# Patient Record
Sex: Male | Born: 1945
Health system: Southern US, Community
[De-identification: ages and names within clinical notes are randomized; demographics above are authoritative.]

## PROBLEM LIST (undated history)

## (undated) DIAGNOSIS — R269 Unspecified abnormalities of gait and mobility: Secondary | ICD-10-CM

## (undated) DIAGNOSIS — G248 Other dystonia: Secondary | ICD-10-CM

## (undated) DIAGNOSIS — G20 Parkinson's disease: Secondary | ICD-10-CM

## (undated) DIAGNOSIS — R413 Other amnesia: Secondary | ICD-10-CM

## (undated) DIAGNOSIS — G20A1 Parkinson's disease without dyskinesia, without mention of fluctuations: Secondary | ICD-10-CM

## (undated) DIAGNOSIS — F444 Conversion disorder with motor symptom or deficit: Secondary | ICD-10-CM

## (undated) HISTORY — PX: CATARACT EXTRACTION: SUR2

## (undated) HISTORY — DX: Unspecified abnormalities of gait and mobility: R26.9

## (undated) HISTORY — DX: Parkinson's disease without dyskinesia, without mention of fluctuations: G20.A1

## (undated) HISTORY — DX: Conversion disorder with motor symptom or deficit: F44.4

## (undated) HISTORY — DX: Parkinson's disease: G20

## (undated) HISTORY — DX: Other dystonia: G24.8

## (undated) HISTORY — PX: ANKLE SURGERY: SHX546

## (undated) HISTORY — DX: Other amnesia: R41.3

---

## 2014-08-09 DIAGNOSIS — R209 Unspecified disturbances of skin sensation: Secondary | ICD-10-CM | POA: Diagnosis not present

## 2014-08-09 DIAGNOSIS — L600 Ingrowing nail: Secondary | ICD-10-CM | POA: Diagnosis not present

## 2014-08-09 DIAGNOSIS — M2011 Hallux valgus (acquired), right foot: Secondary | ICD-10-CM | POA: Diagnosis not present

## 2014-08-09 DIAGNOSIS — B351 Tinea unguium: Secondary | ICD-10-CM | POA: Diagnosis not present

## 2014-08-09 DIAGNOSIS — M79661 Pain in right lower leg: Secondary | ICD-10-CM | POA: Diagnosis not present

## 2014-08-09 DIAGNOSIS — L853 Xerosis cutis: Secondary | ICD-10-CM | POA: Diagnosis not present

## 2014-08-09 DIAGNOSIS — B353 Tinea pedis: Secondary | ICD-10-CM | POA: Diagnosis not present

## 2014-08-20 DIAGNOSIS — G20 Parkinson's disease: Secondary | ICD-10-CM | POA: Diagnosis not present

## 2014-09-13 DIAGNOSIS — I491 Atrial premature depolarization: Secondary | ICD-10-CM | POA: Diagnosis not present

## 2014-09-13 DIAGNOSIS — I10 Essential (primary) hypertension: Secondary | ICD-10-CM | POA: Diagnosis not present

## 2014-09-13 DIAGNOSIS — F411 Generalized anxiety disorder: Secondary | ICD-10-CM | POA: Diagnosis not present

## 2014-09-13 DIAGNOSIS — Z01818 Encounter for other preprocedural examination: Secondary | ICD-10-CM | POA: Diagnosis not present

## 2014-09-13 DIAGNOSIS — M810 Age-related osteoporosis without current pathological fracture: Secondary | ICD-10-CM | POA: Diagnosis not present

## 2014-09-13 DIAGNOSIS — G20 Parkinson's disease: Secondary | ICD-10-CM | POA: Diagnosis not present

## 2014-09-14 DIAGNOSIS — M6281 Muscle weakness (generalized): Secondary | ICD-10-CM | POA: Diagnosis not present

## 2014-09-14 DIAGNOSIS — R293 Abnormal posture: Secondary | ICD-10-CM | POA: Diagnosis not present

## 2014-09-14 DIAGNOSIS — R262 Difficulty in walking, not elsewhere classified: Secondary | ICD-10-CM | POA: Diagnosis not present

## 2014-09-22 DIAGNOSIS — E559 Vitamin D deficiency, unspecified: Secondary | ICD-10-CM | POA: Diagnosis not present

## 2014-09-22 DIAGNOSIS — D649 Anemia, unspecified: Secondary | ICD-10-CM | POA: Diagnosis not present

## 2014-09-22 DIAGNOSIS — N4282 Prostatosis syndrome: Secondary | ICD-10-CM | POA: Diagnosis not present

## 2014-09-22 DIAGNOSIS — E1140 Type 2 diabetes mellitus with diabetic neuropathy, unspecified: Secondary | ICD-10-CM | POA: Diagnosis not present

## 2014-09-22 DIAGNOSIS — E069 Thyroiditis, unspecified: Secondary | ICD-10-CM | POA: Diagnosis not present

## 2014-09-22 DIAGNOSIS — E785 Hyperlipidemia, unspecified: Secondary | ICD-10-CM | POA: Diagnosis not present

## 2014-09-22 DIAGNOSIS — N390 Urinary tract infection, site not specified: Secondary | ICD-10-CM | POA: Diagnosis not present

## 2014-09-24 DIAGNOSIS — R293 Abnormal posture: Secondary | ICD-10-CM | POA: Diagnosis not present

## 2014-09-24 DIAGNOSIS — R262 Difficulty in walking, not elsewhere classified: Secondary | ICD-10-CM | POA: Diagnosis not present

## 2014-09-24 DIAGNOSIS — M6281 Muscle weakness (generalized): Secondary | ICD-10-CM | POA: Diagnosis not present

## 2014-09-27 DIAGNOSIS — R293 Abnormal posture: Secondary | ICD-10-CM | POA: Diagnosis not present

## 2014-09-27 DIAGNOSIS — R262 Difficulty in walking, not elsewhere classified: Secondary | ICD-10-CM | POA: Diagnosis not present

## 2014-09-27 DIAGNOSIS — M6281 Muscle weakness (generalized): Secondary | ICD-10-CM | POA: Diagnosis not present

## 2014-10-04 DIAGNOSIS — R262 Difficulty in walking, not elsewhere classified: Secondary | ICD-10-CM | POA: Diagnosis not present

## 2014-10-04 DIAGNOSIS — M6281 Muscle weakness (generalized): Secondary | ICD-10-CM | POA: Diagnosis not present

## 2014-10-04 DIAGNOSIS — R293 Abnormal posture: Secondary | ICD-10-CM | POA: Diagnosis not present

## 2014-10-14 DIAGNOSIS — J069 Acute upper respiratory infection, unspecified: Secondary | ICD-10-CM | POA: Diagnosis not present

## 2014-10-14 DIAGNOSIS — J449 Chronic obstructive pulmonary disease, unspecified: Secondary | ICD-10-CM | POA: Diagnosis not present

## 2014-10-14 DIAGNOSIS — R05 Cough: Secondary | ICD-10-CM | POA: Diagnosis not present

## 2014-10-14 DIAGNOSIS — G20 Parkinson's disease: Secondary | ICD-10-CM | POA: Diagnosis not present

## 2014-10-18 DIAGNOSIS — R262 Difficulty in walking, not elsewhere classified: Secondary | ICD-10-CM | POA: Diagnosis not present

## 2014-10-18 DIAGNOSIS — M6281 Muscle weakness (generalized): Secondary | ICD-10-CM | POA: Diagnosis not present

## 2014-10-18 DIAGNOSIS — R293 Abnormal posture: Secondary | ICD-10-CM | POA: Diagnosis not present

## 2014-10-25 DIAGNOSIS — M6281 Muscle weakness (generalized): Secondary | ICD-10-CM | POA: Diagnosis not present

## 2014-10-25 DIAGNOSIS — R293 Abnormal posture: Secondary | ICD-10-CM | POA: Diagnosis not present

## 2014-10-25 DIAGNOSIS — R262 Difficulty in walking, not elsewhere classified: Secondary | ICD-10-CM | POA: Diagnosis not present

## 2014-11-16 DIAGNOSIS — N39490 Overflow incontinence: Secondary | ICD-10-CM | POA: Diagnosis not present

## 2014-11-16 DIAGNOSIS — G20 Parkinson's disease: Secondary | ICD-10-CM | POA: Diagnosis not present

## 2014-11-16 DIAGNOSIS — M150 Primary generalized (osteo)arthritis: Secondary | ICD-10-CM | POA: Diagnosis not present

## 2014-11-16 DIAGNOSIS — M810 Age-related osteoporosis without current pathological fracture: Secondary | ICD-10-CM | POA: Diagnosis not present

## 2014-11-16 DIAGNOSIS — F411 Generalized anxiety disorder: Secondary | ICD-10-CM | POA: Diagnosis not present

## 2014-11-16 DIAGNOSIS — I739 Peripheral vascular disease, unspecified: Secondary | ICD-10-CM | POA: Diagnosis not present

## 2014-12-03 DIAGNOSIS — G20 Parkinson's disease: Secondary | ICD-10-CM | POA: Diagnosis not present

## 2014-12-03 DIAGNOSIS — G248 Other dystonia: Secondary | ICD-10-CM | POA: Diagnosis not present

## 2014-12-03 DIAGNOSIS — R258 Other abnormal involuntary movements: Secondary | ICD-10-CM | POA: Diagnosis not present

## 2014-12-03 DIAGNOSIS — F449 Dissociative and conversion disorder, unspecified: Secondary | ICD-10-CM | POA: Diagnosis not present

## 2014-12-03 DIAGNOSIS — G901 Familial dysautonomia [Riley-Day]: Secondary | ICD-10-CM | POA: Diagnosis not present

## 2014-12-03 DIAGNOSIS — F444 Conversion disorder with motor symptom or deficit: Secondary | ICD-10-CM | POA: Diagnosis not present

## 2015-02-15 DIAGNOSIS — M810 Age-related osteoporosis without current pathological fracture: Secondary | ICD-10-CM | POA: Diagnosis not present

## 2015-02-15 DIAGNOSIS — I10 Essential (primary) hypertension: Secondary | ICD-10-CM | POA: Diagnosis not present

## 2015-02-15 DIAGNOSIS — I739 Peripheral vascular disease, unspecified: Secondary | ICD-10-CM | POA: Diagnosis not present

## 2015-02-15 DIAGNOSIS — G20 Parkinson's disease: Secondary | ICD-10-CM | POA: Diagnosis not present

## 2015-02-24 DIAGNOSIS — N39490 Overflow incontinence: Secondary | ICD-10-CM | POA: Diagnosis not present

## 2015-02-24 DIAGNOSIS — R531 Weakness: Secondary | ICD-10-CM | POA: Diagnosis not present

## 2015-02-24 DIAGNOSIS — Z111 Encounter for screening for respiratory tuberculosis: Secondary | ICD-10-CM | POA: Diagnosis not present

## 2015-02-24 DIAGNOSIS — R5383 Other fatigue: Secondary | ICD-10-CM | POA: Diagnosis not present

## 2015-02-25 DIAGNOSIS — R5383 Other fatigue: Secondary | ICD-10-CM | POA: Diagnosis not present

## 2015-02-25 DIAGNOSIS — F411 Generalized anxiety disorder: Secondary | ICD-10-CM | POA: Diagnosis not present

## 2015-02-25 DIAGNOSIS — M150 Primary generalized (osteo)arthritis: Secondary | ICD-10-CM | POA: Diagnosis not present

## 2015-02-25 DIAGNOSIS — M5410 Radiculopathy, site unspecified: Secondary | ICD-10-CM | POA: Diagnosis not present

## 2015-02-25 DIAGNOSIS — I739 Peripheral vascular disease, unspecified: Secondary | ICD-10-CM | POA: Diagnosis not present

## 2015-02-25 DIAGNOSIS — N399 Disorder of urinary system, unspecified: Secondary | ICD-10-CM | POA: Diagnosis not present

## 2015-02-25 DIAGNOSIS — Z01818 Encounter for other preprocedural examination: Secondary | ICD-10-CM | POA: Diagnosis not present

## 2015-02-25 DIAGNOSIS — I10 Essential (primary) hypertension: Secondary | ICD-10-CM | POA: Diagnosis not present

## 2015-02-25 DIAGNOSIS — G20 Parkinson's disease: Secondary | ICD-10-CM | POA: Diagnosis not present

## 2015-02-25 DIAGNOSIS — G214 Vascular parkinsonism: Secondary | ICD-10-CM | POA: Diagnosis not present

## 2015-02-25 DIAGNOSIS — M810 Age-related osteoporosis without current pathological fracture: Secondary | ICD-10-CM | POA: Diagnosis not present

## 2015-02-25 DIAGNOSIS — R531 Weakness: Secondary | ICD-10-CM | POA: Diagnosis not present

## 2015-03-04 DIAGNOSIS — G64 Other disorders of peripheral nervous system: Secondary | ICD-10-CM | POA: Diagnosis not present

## 2015-03-04 DIAGNOSIS — R270 Ataxia, unspecified: Secondary | ICD-10-CM | POA: Diagnosis not present

## 2015-03-04 DIAGNOSIS — R42 Dizziness and giddiness: Secondary | ICD-10-CM | POA: Diagnosis not present

## 2015-03-04 DIAGNOSIS — G20 Parkinson's disease: Secondary | ICD-10-CM | POA: Diagnosis not present

## 2015-03-25 DIAGNOSIS — G20 Parkinson's disease: Secondary | ICD-10-CM | POA: Diagnosis not present

## 2015-03-25 DIAGNOSIS — R42 Dizziness and giddiness: Secondary | ICD-10-CM | POA: Diagnosis not present

## 2015-03-30 DIAGNOSIS — G64 Other disorders of peripheral nervous system: Secondary | ICD-10-CM | POA: Diagnosis not present

## 2015-04-26 DIAGNOSIS — M5410 Radiculopathy, site unspecified: Secondary | ICD-10-CM | POA: Diagnosis not present

## 2015-04-26 DIAGNOSIS — Z23 Encounter for immunization: Secondary | ICD-10-CM | POA: Diagnosis not present

## 2015-04-26 DIAGNOSIS — I10 Essential (primary) hypertension: Secondary | ICD-10-CM | POA: Diagnosis not present

## 2015-04-26 DIAGNOSIS — I739 Peripheral vascular disease, unspecified: Secondary | ICD-10-CM | POA: Diagnosis not present

## 2015-04-26 DIAGNOSIS — M810 Age-related osteoporosis without current pathological fracture: Secondary | ICD-10-CM | POA: Diagnosis not present

## 2015-04-26 DIAGNOSIS — G20 Parkinson's disease: Secondary | ICD-10-CM | POA: Diagnosis not present

## 2015-04-27 DIAGNOSIS — G544 Lumbosacral root disorders, not elsewhere classified: Secondary | ICD-10-CM | POA: Diagnosis not present

## 2015-05-16 DIAGNOSIS — R209 Unspecified disturbances of skin sensation: Secondary | ICD-10-CM | POA: Diagnosis not present

## 2015-05-16 DIAGNOSIS — L853 Xerosis cutis: Secondary | ICD-10-CM | POA: Diagnosis not present

## 2015-05-16 DIAGNOSIS — M79661 Pain in right lower leg: Secondary | ICD-10-CM | POA: Diagnosis not present

## 2015-05-16 DIAGNOSIS — L600 Ingrowing nail: Secondary | ICD-10-CM | POA: Diagnosis not present

## 2015-05-16 DIAGNOSIS — B351 Tinea unguium: Secondary | ICD-10-CM | POA: Diagnosis not present

## 2015-05-16 DIAGNOSIS — M2011 Hallux valgus (acquired), right foot: Secondary | ICD-10-CM | POA: Diagnosis not present

## 2015-05-16 DIAGNOSIS — B353 Tinea pedis: Secondary | ICD-10-CM | POA: Diagnosis not present

## 2015-06-30 DIAGNOSIS — M810 Age-related osteoporosis without current pathological fracture: Secondary | ICD-10-CM | POA: Diagnosis not present

## 2015-06-30 DIAGNOSIS — G20 Parkinson's disease: Secondary | ICD-10-CM | POA: Diagnosis not present

## 2015-06-30 DIAGNOSIS — N39490 Overflow incontinence: Secondary | ICD-10-CM | POA: Diagnosis not present

## 2015-06-30 DIAGNOSIS — I10 Essential (primary) hypertension: Secondary | ICD-10-CM | POA: Diagnosis not present

## 2015-06-30 DIAGNOSIS — F330 Major depressive disorder, recurrent, mild: Secondary | ICD-10-CM | POA: Diagnosis not present

## 2015-06-30 DIAGNOSIS — F411 Generalized anxiety disorder: Secondary | ICD-10-CM | POA: Diagnosis not present

## 2015-07-11 ENCOUNTER — Ambulatory Visit (INDEPENDENT_AMBULATORY_CARE_PROVIDER_SITE_OTHER): Payer: Medicare Other | Admitting: Neurology

## 2015-07-11 ENCOUNTER — Encounter: Payer: Self-pay | Admitting: Neurology

## 2015-07-11 VITALS — BP 143/76 | HR 92 | Ht 67.0 in | Wt 129.0 lb

## 2015-07-11 DIAGNOSIS — G20 Parkinson's disease: Secondary | ICD-10-CM | POA: Diagnosis not present

## 2015-07-11 DIAGNOSIS — R269 Unspecified abnormalities of gait and mobility: Secondary | ICD-10-CM

## 2015-07-11 HISTORY — DX: Unspecified abnormalities of gait and mobility: R26.9

## 2015-07-11 MED ORDER — CARBIDOPA-LEVODOPA 25-100 MG PO TABS: 2.0000 | ORAL_TABLET | Freq: Three times a day (TID) | ORAL | Status: DC

## 2015-07-11 NOTE — Progress Notes (Signed)
Reason for visit: Parkinson's disease  Referring physician: Dr. Elspeth Cho  Bradley Fleming is a 69 y.o. male  History of present illness:  Bradley Fleming is a 69 year old right-handed black male with a three-year history of a gradually progressive gait disorder. The patient began noting very early in the process that he was having difficulty straightening up his back. When he would stand up, he would develop a stooped posture that was quite prominent, associated with some back pain. When he sits down, the back pain goes away. The patient does have some gait instability, he will fall on occasion. He has fallen on 2 occasions within the last 3 or 4 months. The patient is using a walker for ambulation, he generally does not fall when using a walker. He does have some mild tremors affecting the jaw, occasionally with the left arm. The patient denies any weakness per se with exception that he has difficulty with flexion at the hip on the left. He denies issues controlling the bowels or the bladder. He has some difficulty with swallowing, particularly with liquids. He denies any visual complaints, or memory problems. The patient has been noncompliant with his medications, he just started taking his medications regularly 2 months ago. He is moving from the [place] area to this area, he has been here 5 days. He has not been exercising until just recently. With the medications and exercise, he is already made some improvement with his walking, but he continues to have significant flexion with standing. 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  Past Medical History  Diagnosis Date  . Parkinson disease (HCC)   . Focal dystonia   . Camptocormia   . Abnormality of gait 07/11/2015    Past Surgical History  Procedure Laterality Date  . Ankle surgery Right   . Cataract extraction      Family History  Problem Relation Age of Onset    . Heart failure Mother   . Cirrhosis Father   . Alcohol abuse Father   . Tremor Sister   . COPD Brother   . Diabetes Brother   . Heart failure Brother     Social history:  reports that he has never smoked. He has never used smokeless tobacco. He reports that he does not drink alcohol or use illicit drugs.  Medications:  Prior to Admission medications   Medication Sig Start Date End Date Taking? Authorizing Provider  carbidopa-levodopa (SINEMET IR) 25-100 MG tablet Take 2 tablets by mouth 3 (three) times daily.   Yes Historical Provider, MD  PARoxetine (PAXIL) 10 MG tablet Take 10 mg by mouth daily.   Yes Historical Provider, MD      Allergies  Allergen Reactions  . Banana     ROS:  Out of a complete 14 system review of symptoms, the patient complains only of the following symptoms, and all other reviewed systems are negative.  Constipation Memory loss, slurred speech, dizziness Depression  Blood pressure 143/76, pulse 92, height  (1.702 m), weight 129 lb (58.514 kg).  Physical Exam  General: The patient is alert and cooperative at the time of the examination.  Eyes: Pupils are equal, round, and reactive to light. Discs are flat bilaterally.  Neck: The neck is supple, no carotid bruits are noted.  Respiratory: The respiratory examination is clear.  Cardiovascular: The cardiovascular examination reveals a regular rate and rhythm, no obvious murmurs or rubs are noted.  Skin: Extremities are without significant edema.  Neurologic Exam  Mental status: The patient is alert and oriented x 3 at the time of the examination. The patient has apparent normal recent and remote memory, with an apparently normal attention span and concentration ability.  Cranial nerves: Facial symmetry is not present, there is a depression of the right nasolabial fold. There is good sensation of the face to pinprick and soft touch bilaterally. The strength of the facial muscles and the  muscles to head turning and shoulder shrug are normal bilaterally. Speech is well enunciated, no aphasia or dysarthria is noted. Extraocular movements are full. Visual fields are full. The tongue is midline, and the patient has symmetric elevation of the soft palate. No obvious hearing deficits are noted. Masking of the face is seen.  Motor: The motor testing reveals 5 over 5 strength of all 4 extremities, with exception that there is 4/5 strength with hip flexion on the left. Good symmetric motor tone is noted throughout.  Sensory: Sensory testing is intact to pinprick, soft touch, vibration sensation, and position sense on all 4 extremities, with exception that there is a stocking pattern pinprick sensory deficit up to the knees bilaterally.. No evidence of extinction is noted.  Coordination: Cerebellar testing reveals good finger-nose-finger and heel-to-shin bilaterally.  Gait and station: The patient is unable to stand with the arms crossed from a seated position. He is able to stand up otherwise, but he has a flexed posture, he is able to ambulate independently, slowness with walking. Tandem gait is unsteady, Romberg is negative.  Reflexes: Deep tendon reflexes are symmetric and normal bilaterally. Toes are downgoing bilaterally.   Assessment/Plan:  1. Parkinson's disease  2. Gait disorder  3. Low back pain  The patient has significant flexion with standing, felt to have camptocormia. He did not respond Botox therapy. He has not been compliant with medical therapy or with exercise until just recently. I will leave him on his current dose of Sinemet, prescription was called in. He will follow-up in 4 months. We may increase the medication significantly over time. I have emphasized that exercise needs to be done on a regular basis. They will contact me if new issues arise.  Bradley Fleming. Keith Genny Caulder MD 07/11/2015 7:07 PM  [facility] [address] [place], [place]  [id number]  Phone [phone] Fax [phone]

## 2015-07-11 NOTE — Patient Instructions (Signed)
Fall Prevention in the Home  Falls can cause injuries and can affect people from all age groups. There are many simple things that you can do to make your home safe and to help prevent falls. WHAT CAN I DO ON THE OUTSIDE OF MY HOME?  Regularly repair the edges of walkways and driveways and fix any cracks.  Remove high doorway thresholds.  Trim any shrubbery on the main path into your home.  Use bright outdoor lighting.  Clear walkways of debris and clutter, including tools and rocks.  Regularly check that handrails are securely fastened and in good repair. Both sides of any steps should have handrails.  Install guardrails along the edges of any raised decks or porches.  Have leaves, snow, and ice cleared regularly.  Use sand or salt on walkways during winter months.  In the garage, clean up any spills right away, including grease or oil spills. WHAT CAN I DO IN THE BATHROOM?  Use night lights.  Install grab bars by the toilet and in the tub and shower. Do not use towel bars as grab bars.  Use non-skid mats or decals on the floor of the tub or shower.  If you need to sit down while you are in the shower, use a plastic, non-slip stool..  Keep the floor dry. Immediately clean up any water that spills on the floor.  Remove soap buildup in the tub or shower on a regular basis.  Attach bath mats securely with double-sided non-slip rug tape.  Remove throw rugs and other tripping hazards from the floor. WHAT CAN I DO IN THE BEDROOM?  Use night lights.  Make sure that a bedside light is easy to reach.  Do not use oversized bedding that drapes onto the floor.  Have a firm chair that has side arms to use for getting dressed.  Remove throw rugs and other tripping hazards from the floor. WHAT CAN I DO IN THE KITCHEN?   Clean up any spills right away.  Avoid walking on wet floors.  Place frequently used items in easy-to-reach places.  If you need to reach for something  above you, use a sturdy step stool that has a grab bar.  Keep electrical cables out of the way.  Do not use floor polish or wax that makes floors slippery. If you have to use wax, make sure that it is non-skid floor wax.  Remove throw rugs and other tripping hazards from the floor. WHAT CAN I DO IN THE STAIRWAYS?  Do not leave any items on the stairs.  Make sure that there are handrails on both sides of the stairs. Fix handrails that are broken or loose. Make sure that handrails are as long as the stairways.  Check any carpeting to make sure that it is firmly attached to the stairs. Fix any carpet that is loose or worn.  Avoid having throw rugs at the top or bottom of stairways, or secure the rugs with carpet tape to prevent them from moving.  Make sure that you have a light switch at the top of the stairs and the bottom of the stairs. If you do not have them, have them installed. WHAT ARE SOME OTHER FALL PREVENTION TIPS?  Wear closed-toe shoes that fit well and support your feet. Wear shoes that have rubber soles or low heels.  When you use a stepladder, make sure that it is completely opened and that the sides are firmly locked. Have someone hold the ladder while you   are using it. Do not climb a closed stepladder.  Add color or contrast paint or tape to grab bars and handrails in your home. Place contrasting color strips on the first and last steps.  Use mobility aids as needed, such as canes, walkers, scooters, and crutches.  Turn on lights if it is dark. Replace any light bulbs that burn out.  Set up furniture so that there are clear paths. Keep the furniture in the same spot.  Fix any uneven floor surfaces.  Choose a carpet design that does not hide the edge of steps of a stairway.  Be aware of any and all pets.  Review your medicines with your healthcare provider. Some medicines can cause dizziness or changes in blood pressure, which increase your risk of falling. Talk  with your health care provider about other ways that you can decrease your risk of falls. This may include working with a physical therapist or trainer to improve your strength, balance, and endurance.   This information is not intended to replace advice given to you by your health care provider. Make sure you discuss any questions you have with your health care provider.   Document Released: 07/06/2002 Document Revised: 11/30/2014 Document Reviewed: 08/20/2014 Elsevier Interactive Patient Education 2016 Elsevier Inc.  

## 2015-07-12 ENCOUNTER — Telehealth: Payer: Self-pay | Admitting: Neurology

## 2015-07-12 MED ORDER — PAROXETINE HCL 10 MG PO TABS: 10.0000 mg | ORAL_TABLET | Freq: Every day | ORAL | Status: DC

## 2015-07-12 NOTE — Telephone Encounter (Signed)
Patient's wife is calling and states that her husband needs his Paxil prescribed.  She wanted to know if Dr. Anne HahnWillis could treat him for his depression also as they just moved here and need a doctor for the depression as well as Parkinson.  Please call the patient's wife and let her know if that is possible or if her husband needs to change doctors.  Thanks!

## 2015-07-12 NOTE — Telephone Encounter (Signed)
I called and talked with the wife, I will call in a prescription for the Paxil.

## 2015-08-11 DIAGNOSIS — G20 Parkinson's disease: Secondary | ICD-10-CM | POA: Diagnosis not present

## 2015-08-11 DIAGNOSIS — F331 Major depressive disorder, recurrent, moderate: Secondary | ICD-10-CM | POA: Diagnosis not present

## 2015-08-18 DIAGNOSIS — H47329 Drusen of optic disc, unspecified eye: Secondary | ICD-10-CM | POA: Diagnosis not present

## 2015-08-18 DIAGNOSIS — H40023 Open angle with borderline findings, high risk, bilateral: Secondary | ICD-10-CM | POA: Diagnosis not present

## 2015-08-18 DIAGNOSIS — T8522XA Displacement of intraocular lens, initial encounter: Secondary | ICD-10-CM | POA: Diagnosis not present

## 2015-08-22 DIAGNOSIS — F411 Generalized anxiety disorder: Secondary | ICD-10-CM | POA: Diagnosis not present

## 2015-08-22 DIAGNOSIS — F331 Major depressive disorder, recurrent, moderate: Secondary | ICD-10-CM | POA: Diagnosis not present

## 2015-08-30 DIAGNOSIS — M216X1 Other acquired deformities of right foot: Secondary | ICD-10-CM | POA: Diagnosis not present

## 2015-08-30 DIAGNOSIS — G64 Other disorders of peripheral nervous system: Secondary | ICD-10-CM | POA: Diagnosis not present

## 2015-08-30 DIAGNOSIS — M216X2 Other acquired deformities of left foot: Secondary | ICD-10-CM | POA: Diagnosis not present

## 2015-08-30 DIAGNOSIS — L602 Onychogryphosis: Secondary | ICD-10-CM | POA: Diagnosis not present

## 2015-08-31 DIAGNOSIS — L602 Onychogryphosis: Secondary | ICD-10-CM | POA: Diagnosis not present

## 2015-08-31 DIAGNOSIS — B351 Tinea unguium: Secondary | ICD-10-CM | POA: Diagnosis not present

## 2015-09-06 DIAGNOSIS — Z131 Encounter for screening for diabetes mellitus: Secondary | ICD-10-CM | POA: Diagnosis not present

## 2015-09-06 DIAGNOSIS — Z13220 Encounter for screening for lipoid disorders: Secondary | ICD-10-CM | POA: Diagnosis not present

## 2015-09-06 DIAGNOSIS — G8929 Other chronic pain: Secondary | ICD-10-CM | POA: Diagnosis not present

## 2015-09-06 DIAGNOSIS — Z0000 Encounter for general adult medical examination without abnormal findings: Secondary | ICD-10-CM | POA: Diagnosis not present

## 2015-09-06 DIAGNOSIS — Z136 Encounter for screening for cardiovascular disorders: Secondary | ICD-10-CM | POA: Diagnosis not present

## 2015-09-06 DIAGNOSIS — M4000 Postural kyphosis, site unspecified: Secondary | ICD-10-CM | POA: Diagnosis not present

## 2015-09-06 DIAGNOSIS — M546 Pain in thoracic spine: Secondary | ICD-10-CM | POA: Diagnosis not present

## 2015-09-06 DIAGNOSIS — N529 Male erectile dysfunction, unspecified: Secondary | ICD-10-CM | POA: Diagnosis not present

## 2015-10-03 ENCOUNTER — Telehealth: Payer: Self-pay | Admitting: Neurology

## 2015-10-03 DIAGNOSIS — G20 Parkinson's disease: Secondary | ICD-10-CM

## 2015-10-03 NOTE — Telephone Encounter (Signed)
I called the patient. The patient has apparently said unusual things last night, the patient himself indicates that he was sleeping at the time. He does have vivid drains and he will act out dreams. This only happened on one occasion, has not recurred. He is not confused during the day. It is true that the Sinemet should be taken at a time not concurrent with a high-protein meal. The protein may reduce absorption of the medication.  The wife is requesting physical therapy for the patient, I'll try to get this set up.

## 2015-10-03 NOTE — Telephone Encounter (Signed)
Patient's wife Bradley Hashimotoatricia stated that her husband was acting strange yesterday and was saying things that didn't make sense but is better today. She was advised to take him to the ER if needed. She also stated she needs a form for a handicapped parking tag filled out and she will leave at front desk today.  Thanks!

## 2015-10-03 NOTE — Telephone Encounter (Signed)
Spoke to pt's wife. She reports that yesterday pt was complaining of having "hot sauce in his eyes" even though he did not eat anything with hot sauce. Pt was also talking about dying and coming back as a bird or dog. Pt's wife states that this is very unusual for the pt.  Pt's wife also reports that she went to a meeting about Parkinson's disease given by [facility], and was told in the meeting to not give the pt his sinemet if he had recently eaten a lot of protein. She is asking if Dr. Anne HahnWillis thinks this is correct. She reports that the pt sleeps a lot after taking the sinemet as well.  I advised pt's wife that I would forward this message to Dr. Anne HahnWillis.

## 2015-10-05 DIAGNOSIS — F411 Generalized anxiety disorder: Secondary | ICD-10-CM | POA: Diagnosis not present

## 2015-10-05 DIAGNOSIS — F331 Major depressive disorder, recurrent, moderate: Secondary | ICD-10-CM | POA: Diagnosis not present

## 2015-10-26 DIAGNOSIS — L602 Onychogryphosis: Secondary | ICD-10-CM | POA: Diagnosis not present

## 2015-11-07 ENCOUNTER — Ambulatory Visit: Payer: Medicare Other | Attending: Neurology | Admitting: Physical Therapy

## 2015-11-07 DIAGNOSIS — R2689 Other abnormalities of gait and mobility: Secondary | ICD-10-CM | POA: Insufficient documentation

## 2015-11-07 DIAGNOSIS — R293 Abnormal posture: Secondary | ICD-10-CM | POA: Diagnosis not present

## 2015-11-07 NOTE — Therapy (Signed)
[facility] [address] [place], [place], [address] Phone: [phone]   Fax:  [phone]  Physical Therapy Evaluation  Patient Details  Name: Bradley Fleming MRN: [id number] Date of Birth: 06-27-1946 Referring Provider: Dr. Anne Hahn  Encounter Date: 11/07/2015      PT End of Session - 11/07/15 2059    Visit Number 1   Number of Visits 17   Date for PT Re-Evaluation 01/06/16   Authorization Type Medicare/BCBS secondary   PT Start Time 1020   PT Stop Time 1100   PT Time Calculation (min) 40 min   Activity Tolerance Patient tolerated treatment well   Behavior During Therapy [facility] for tasks assessed/performed      Past Medical History  Diagnosis Date  . Parkinson disease (HCC)   . Focal dystonia   . Camptocormia   . Abnormality of gait 07/11/2015    Past Surgical History  Procedure Laterality Date  . Ankle surgery Right   . Cataract extraction      There were no vitals filed for this visit.       Subjective Assessment - 11/07/15 1025    Subjective Pt is a 70 year old male who presents to OP PT wtih c/o bending over more and more; pt reports decreased balance.  He has had 2 falls in the past 2 months.  Falls occurred while walking and when trying to sit at the table.  He uses cane at home and uses the 4-wheeled RW for longer distances.   Patient is accompained by: Family member  wife-Patricia   Limitations --  Wife reports difficulty getting in and out of car   Patient Stated Goals Wants to build up muscles to improve posture.   Currently in Pain? Yes   Pain Score 5    Pain Location Back   Pain Descriptors / Indicators Aching   Pain Type Chronic pain   Pain Onset More than a month ago   Pain Frequency Intermittent   Aggravating Factors  Bending over more aggravates pain   Pain Relieving Factors lying down alleviates pain   Effect of Pain on Daily Activities PT will monitor; will attempt to address  with stretching and exercises.            [facility] PT Assessment - 11/07/15 0001    Assessment   Medical Diagnosis Parkinson's disease  dx 3 years ago (in [place])   Referring Provider Dr. Anne Hahn   Precautions   Precautions Fall  No driving   Balance Screen   Has the patient fallen in the past 6 months Yes   How many times? 2   Has the patient had a decrease in activity level because of a fear of falling?  No   Is the patient reluctant to leave their home because of a fear of falling?  No   Home Nurse, mental health Private residence   Living Arrangements Spouse/significant other   Available Help at Discharge Family   Type of Home --  [facility]   Home Access Level entry   Home Layout Two level;Bed/bath upstairs   Alternate Level Stairs-Rails Right   Home Equipment Walker - 4 wheels;Cane - single point;Tub bench   Prior Function   Level of Independence Independent with basic ADLs;Independent with household mobility with device  Slowed with gait and ADLs   Leisure [employer] 2-3 times per week   Posture/Postural Control   Posture/Postural Control Postural limitations  Postural Limitations Rounded Shoulders;Forward head;Posterior pelvic tilt  Camptocormia with excessive trunk flexion   Posture Comments L shoulder lower than right   ROM / Strength   AROM / PROM / Strength Strength;PROM  Pt reports L side weaker and stiffer   Strength   Strength Assessment Site Hip;Knee;Ankle   Right/Left Hip Right;Left   Right Hip Flexion 3+/5   Left Hip Flexion 3+/5   Right/Left Knee Right;Left   Right Knee Flexion 4/5   Right Knee Extension 4/5   Left Knee Flexion 3+/5   Left Knee Extension 4/5   Right/Left Ankle Right;Left   Right Ankle Dorsiflexion 4/5   Left Ankle Dorsiflexion 4/5   Transfers   Transfers Sit to Stand;Stand to Sit   Sit to Stand 5: Supervision;Without upper extremity assist;From chair/3-in-1   Sit to Stand Details --  does not  fully stand erect   Five time sit to stand comments  32.72   Stand to Sit 5: Supervision;Without upper extremity assist;To chair/3-in-1   Stand to Sit Details With sit<>stand in preparation for gait, pt places hands on walker to push up to stand.   Ambulation/Gait   Ambulation/Gait Yes   Ambulation/Gait Assistance 5: Supervision   Ambulation Distance (Feet) 150 Feet   Assistive device Rollator   Gait Pattern Step-through pattern;Decreased step length - right;Decreased step length - left;Trunk flexed;Poor foot clearance - left;Poor foot clearance - right   Ambulation Surface Level;Indoor   Gait velocity 21.05= 1.56 ft/sec   Standardized Balance Assessment   Standardized Balance Assessment Timed Up and Go Test   Timed Up and Go Test   Normal TUG (seconds) 32.26   High Level Balance   High Level Balance Comments Pt stands 90 seconds with increase in low back pain to 6-7/10.                             PT Short Term Goals - 11/07/15 2105    PT SHORT TERM GOAL #1   Title Pt will perform HEP with family supervision for improved posture, transfers, functional mobility and gait.  TARGET 12/07/15   Time 4   Period Weeks   Status New   PT SHORT TERM GOAL #2   Title Pt will improve 5x sit<>stand transfers to less than or equal to 25 seconds for improved transfer efficiency and safety.   Time 4   Period Weeks   Status New   PT SHORT TERM GOAL #3   Title Pt will improve TUG score to less than or equal to 25 seconds for decreased fall risk.   Time 4   Period Weeks   Status New   PT SHORT TERM GOAL #4   Title Pt will report at least 25% improvement in car transfers, for decreased caregiver burden.   Time 4   Period Weeks   Status New   PT SHORT TERM GOAL #5   Title 3 minute walk test to be assessed, with pt ambulating at least 50 additional ft in 3 minute walk, to demonstrate improved efficiency and safety with gait.   Time 4   Period Weeks   Status New            PT Long Term Goals - 11/07/15 2109    PT LONG TERM GOAL #1   Title Pt/family will verbalize understanding of fall prevention within the home environment.  TARGET 01/06/16   Time 8   Period Weeks  Status New   PT LONG TERM GOAL #2   Title Pt will improve 5x sit<>stand to less than or equal to 20 seconds for improved transfer efficiency and safety.   Time 8   Period Weeks   Status New   PT LONG TERM GOAL #3   Title Pt will improve TUG score to less than or equal 20 seconds for decreased fall risk.   Time 8   Period Weeks   Status New   PT LONG TERM GOAL #4   Title Pt will improve gait velocity to at least 1.8 ft/sec for decreased fall risk and improved gait efficiency and safety.   Time 8   Period Weeks   Status New   PT LONG TERM GOAL #5   Title Pt will report at least 25% improvement in bed mobility for improved functional mobility.   Time 8   Period Weeks   Status New   Additional Long Term Goals   Additional Long Term Goals Yes   PT LONG TERM GOAL #6   Title Pt will verbalize plans for continued community fitness upon D/C from PT.   Time 8   Period Weeks   Status New               Plan - 12-06-15 12-27-2057    Clinical Impression Statement Pt is a 70 year old male who presents to OP PT with history of Parkinson's disease with dystonia and camptocormia, history of ankle surgery and cataract removal.  Pt has recently moved to [facility] from [place] and is seeking therapy to assist with posture and strengthening.  Pt presents with forward flexed posture, back pain with increasing forward posture in unsupported standing, decreased strength, bradykinesia, slowed gait velocity, decreased timing and coordination of gait.  Pt presents as stable.  Pt does present as high fall risk in Berg and gait velocity scores.  Attempted Berg, but unable due to pain increasing in back with unsupported standing.  Pt would benefit from skilled PT to address the above stated deficits to improve  functional mobility and decrease fall risk.   Rehab Potential Good   PT Frequency 2x / week   PT Duration 8 weeks  plus eval   PT Treatment/Interventions ADLs/Self Care Home Management;Therapeutic exercise;Therapeutic activities;Functional mobility training;Gait training;DME Instruction;Balance training;Neuromuscular re-education;Patient/family education   PT Next Visit Plan Sit<>stand transfer technique, practice car transfers as able; initiate PWR! Moves in sitting and standing; Assess 3 minute walk test   Recommended Other Services Pt would benefit from OT services due to c/o increased time and effort with ADLs   Consulted and Agree with Plan of Care Patient;Family member/caregiver   Family Member Consulted wife      Patient will benefit from skilled therapeutic intervention in order to improve the following deficits and impairments:  Abnormal gait, Decreased activity tolerance, Decreased balance, Decreased mobility, Decreased endurance, Decreased strength, Difficulty walking, Postural dysfunction, Pain  Visit Diagnosis: Other abnormalities of gait and mobility  Abnormal posture      G-Codes - 2015/12/06 Dec 27, 2116    Functional Assessment Tool Used 5x sit<>stand 32.72 sec, TUG 32.26 sec, gait velocity 1.56 ft/sec; stands 1:30 unsupported prior to needing to sit due to back pain   Functional Limitation Mobility: Walking and moving around   Mobility: Walking and Moving Around Current Status [phone]) At least 40 percent but less than 60 percent impaired, limited or restricted   Mobility: Walking and Moving Around Goal Status (U0454) At least 20 percent but less  than 40 percent impaired, limited or restricted       Problem List Patient Active Problem List   Diagnosis Date Noted  . Parkinson disease (HCC) 07/11/2015  . Abnormality of gait 07/11/2015    MARRIOTT,AMY W. 11/07/2015, 9:20 PM  Gean MaidensMARRIOTT,AMY W., PT  [facility] [address] [place], [place], [address] Phone: [phone]   Fax:  [phone]  Name: Bradley Fleming MRN: [id number] Date of Birth: 08/25/1945

## 2015-11-08 ENCOUNTER — Other Ambulatory Visit: Payer: Self-pay | Admitting: Neurology

## 2015-11-08 DIAGNOSIS — G20 Parkinson's disease: Secondary | ICD-10-CM

## 2015-11-09 ENCOUNTER — Ambulatory Visit (INDEPENDENT_AMBULATORY_CARE_PROVIDER_SITE_OTHER): Payer: Medicare Other | Admitting: Neurology

## 2015-11-09 ENCOUNTER — Encounter: Payer: Self-pay | Admitting: Neurology

## 2015-11-09 VITALS — BP 123/83 | HR 82 | Ht 67.0 in | Wt 125.0 lb

## 2015-11-09 DIAGNOSIS — R269 Unspecified abnormalities of gait and mobility: Secondary | ICD-10-CM

## 2015-11-09 DIAGNOSIS — G20 Parkinson's disease: Secondary | ICD-10-CM | POA: Diagnosis not present

## 2015-11-09 DIAGNOSIS — R413 Other amnesia: Secondary | ICD-10-CM

## 2015-11-09 HISTORY — DX: Other amnesia: R41.3

## 2015-11-09 MED ORDER — CARBIDOPA-LEVODOPA 25-100 MG PO TABS: 2.0000 | ORAL_TABLET | Freq: Three times a day (TID) | ORAL | Status: DC

## 2015-11-09 NOTE — Progress Notes (Signed)
Reason for visit: Parkinson's disease  Bradley Fleming is an 71 y.o. male  History of present illness:  Bradley Fleming is a 70 year old right-handed black male with a history of Parkinson's disease associated with a gait disorder. The patient has significant flexion of the low back with standing consistent with camptocormia. He reports no significant falls since last seen. He remains on Sinemet, taking the medication daily, 2 of the 25/100 mg tablets 3 times daily. The patient has had some episodes of confusion that may occur when he is drowsy, or coming out of sleep. He is working out at [employer] on Mondays, Wednesdays, and Fridays. He appears to be easily distracted at times. His wife indicates that he does not keep up with his medications, appointments, and he has difficulty with remembering names. At other times, he appears to be completely lucid. He does report some occasional swallowing problems with solids and liquids. He comes to this office for an evaluation.  Past Medical History  Diagnosis Date  . Parkinson disease (HCC)   . Focal dystonia   . Camptocormia   . Abnormality of gait 07/11/2015    Past Surgical History  Procedure Laterality Date  . Ankle surgery Right   . Cataract extraction      Family History  Problem Relation Age of Onset  . Heart failure Mother   . Cirrhosis Father   . Alcohol abuse Father   . Tremor Sister   . COPD Brother   . Diabetes Brother   . Heart failure Brother     Social history:  reports that he has quit smoking. He has never used smokeless tobacco. He reports that he does not drink alcohol or use illicit drugs.    Allergies  Allergen Reactions  . Banana     Medications:  Prior to Admission medications   Medication Sig Start Date End Date Taking? Authorizing Provider  b complex vitamins tablet Take 1 tablet by mouth daily.   Yes Historical Provider, MD  carbidopa-levodopa (SINEMET IR) 25-100 MG tablet Take 2 tablets by mouth 3  (three) times daily. 07/11/15  Yes [name], MD  PARoxetine (PAXIL) 10 MG tablet Take 1 tablet (10 mg total) by mouth daily. 07/12/15  Yes [name], MD  sildenafil (REVATIO) 20 MG tablet  09/08/15  Yes Historical Provider, MD    ROS:  Out of a complete 14 system review of symptoms, the patient complains only of the following symptoms, and all other reviewed systems are negative.  Decreased activity Runny nose, difficulty swallowing, drooling Leg swelling Sleep talking Frequency of urination, urinary urgency Back pain, achy muscles, muscle cramps, walking difficulty, neck pain, neck stiffness Memory loss, dizziness, weakness, tremors Agitation, depression, anxiety  Blood pressure 123/83, pulse 82, height  (1.702 m), weight 125 lb (56.7 kg).  Physical Exam  General: The patient is alert and cooperative at the time of the examination.  Skin: No significant peripheral edema is noted.   Neurologic Exam  Mental status: The patient is alert and oriented x 2 at the time of the examination (Not oriented to date). The Mini-Mental Status Examination done today shows a total score 25/30. The patient is able to name 14 animals in 30 seconds..   Cranial nerves: Facial symmetry is present. Masking of the face is seen. Speech is normal, no aphasia or dysarthria is noted. Extraocular movements are full. Visual fields are full.  Motor: The patient has good strength in all 4 extremities.  Sensory examination: Soft touch sensation is symmetric on the face, arms, and legs.  Coordination: The patient has good finger-nose-finger and heel-to-shin bilaterally.  Gait and station: The patient is able to arise from a seated position with arms crossed with some difficulty. Once up, he has a flexed posture at the low back. He walks with a walker, has good strides and returns. Romberg is negative. Tandem gait is minimally unsteady.  Reflexes: Deep tendon reflexes are  symmetric.   Assessment/Plan:  1. Parkinson's disease  2. Gait disturbance  3. Mild memory disorder  The patient will be followed for memory issues over time. He will continue his current dose of Sinemet. He will follow-up in about 5 months. He is to remain active, continuing to exercise regularly.  Bradley Palau. Keith Willis MD 11/09/2015 7:47 PM  [facility] [address] [place], [place] [id number]  Phone [phone] Fax [phone]

## 2015-11-16 ENCOUNTER — Ambulatory Visit: Payer: Medicare Other | Admitting: Physical Therapy

## 2015-11-21 ENCOUNTER — Ambulatory Visit: Payer: Medicare Other | Admitting: Physical Therapy

## 2015-11-21 ENCOUNTER — Encounter: Payer: Self-pay | Admitting: Physical Therapy

## 2015-11-21 DIAGNOSIS — R293 Abnormal posture: Secondary | ICD-10-CM

## 2015-11-21 DIAGNOSIS — R2689 Other abnormalities of gait and mobility: Secondary | ICD-10-CM

## 2015-11-21 NOTE — Therapy (Signed)
[facility] [address] [place], [place], [address] Phone: [phone]   Fax:  [phone]  Physical Therapy Treatment  Patient Details  Name: Jamian Andujo MRN: [id number] Date of Birth: 01-27-1946 Referring Provider: Dr. Anne Hahn  Encounter Date: 11/21/2015        PT End of Session - 11/21/15 1448    Visit Number 2   Number of Visits 17   Date for PT Re-Evaluation 01/06/16   Authorization Type Medicare/BCBS secondary   PT Start Time 1405   PT Stop Time 1443   PT Time Calculation (min) 38 min   Activity Tolerance Patient tolerated treatment well   Behavior During Therapy [facility] for tasks assessed/performed      Past Medical History  Diagnosis Date  . Parkinson disease (HCC)   . Focal dystonia   . Camptocormia   . Abnormality of gait 07/11/2015  . Memory disorder 11/09/2015    Past Surgical History  Procedure Laterality Date  . Ankle surgery Right   . Cataract extraction      There were no vitals filed for this visit.      Subjective Assessment - 11/21/15 1409    Subjective Nothing new to report. Reports doing Silver Sneakers exercise group 3x/week (standing and seated)   Patient is accompained by: Family member  wife-Patricia   Limitations --  Wife reports difficulty getting in and out of car   Patient Stated Goals Wants to build up muscles to improve posture.   Currently in Pain? No/denies   Pain Onset More than a month ago                         [facility] Adult PT Treatment/Exercise - 11/21/15 0001    Ambulation/Gait   Ambulation/Gait Yes   Ambulation/Gait Assistance 5: Supervision   Ambulation/Gait Assistance Details walk test   Ambulation Distance (Feet) 330 Feet   Assistive device Rollator   Gait Pattern Step-to pattern;Decreased stride length  kyphotic posture   Ambulation Surface Level       Standing Posture strengthening: performed as shown in handout  below.         PT Education - 11/21/15 1425    Education provided Yes   Education Details Walking Program, posture strengthening   Person(s) Educated Patient   Methods Explanation;Handout   Comprehension Verbalized understanding;Need further instruction          PT Short Term Goals - 11/07/15 2105    PT SHORT TERM GOAL #1   Title Pt will perform HEP with family supervision for improved posture, transfers, functional mobility and gait.  TARGET 12/07/15   Time 4   Period Weeks   Status New   PT SHORT TERM GOAL #2   Title Pt will improve 5x sit<>stand transfers to less than or equal to 25 seconds for improved transfer efficiency and safety.   Time 4   Period Weeks   Status New   PT SHORT TERM GOAL #3   Title Pt will improve TUG score to less than or equal to 25 seconds for decreased fall risk.   Time 4   Period Weeks   Status New   PT SHORT TERM GOAL #4   Title Pt will report at least 25% improvement in car transfers, for decreased caregiver burden.   Time 4   Period Weeks   Status New   PT SHORT TERM GOAL #5   Title 3 minute walk  test to be assessed, with pt ambulating at least 50 additional ft in 3 minute walk, to demonstrate improved efficiency and safety with gait.   Time 4   Period Weeks   Status New           PT Long Term Goals - 11/07/15 2109    PT LONG TERM GOAL #1   Title Pt/family will verbalize understanding of fall prevention within the home environment.  TARGET 01/06/16   Time 8   Period Weeks   Status New   PT LONG TERM GOAL #2   Title Pt will improve 5x sit<>stand to less than or equal to 20 seconds for improved transfer efficiency and safety.   Time 8   Period Weeks   Status New   PT LONG TERM GOAL #3   Title Pt will improve TUG score to less than or equal 20 seconds for decreased fall risk.   Time 8   Period Weeks   Status New   PT LONG TERM GOAL #4   Title Pt will improve gait velocity to at least 1.8 ft/sec for decreased fall risk and  improved gait efficiency and safety.   Time 8   Period Weeks   Status New   PT LONG TERM GOAL #5   Title Pt will report at least 25% improvement in bed mobility for improved functional mobility.   Time 8   Period Weeks   Status New   Additional Long Term Goals   Additional Long Term Goals Yes   PT LONG TERM GOAL #6   Title Pt will verbalize plans for continued community fitness upon D/C from PT.   Time 8   Period Weeks   Status New               Plan - 11/21/15 1438    Clinical Impression Statement Initated HEP, pt reports being challenged by both walking and standing posture strengthening requiring seated rest.   PT Next Visit Plan Review HEP; Sit<>stand transfer technique, practice car transfers as able; initiate PWR! Moves in sitting and standing;       Patient will benefit from skilled therapeutic intervention in order to improve the following deficits and impairments:     Visit Diagnosis: Other abnormalities of gait and mobility  Abnormal posture     Problem List Patient Active Problem List   Diagnosis Date Noted  . Memory disorder 11/09/2015  . Parkinson disease (HCC) 07/11/2015  . Abnormality of gait 07/11/2015    Hortencia ConradiKarissa Dupree Givler, PTA  11/21/2015, 2:47 PM [place] [facility] [address] [place], [place], [address] Phone: [phone]   Fax:  [phone]  Name: Ferd GlassingGary David Dardis MRN: [id number] Date of Birth: 10/17/1945

## 2015-11-21 NOTE — Patient Instructions (Addendum)
Walking Program:  Begin walking for exercise for *3** minutes,rest then another 3 min/day, **3* days/week.   Progress your walking program by adding *1-2** minutes to your routine each week, as tolerated. Be sure to wear good walking shoes, walk in a safe environment and only progress to your tolerance.       Posture exercises Standing at the door frame: Stand with back against the door frame  1. Head retraction- keep chin level and pull head straight back to frame. 2. Shoulder blade squeezes 3. Alteranate Arm raises- Raise arm up towards door frame Hold each for 5 seconds, 5x each, perform 1-2x/day  * Remember to keep head level and shoulder back towards door frame

## 2015-11-23 DIAGNOSIS — D649 Anemia, unspecified: Secondary | ICD-10-CM | POA: Diagnosis not present

## 2015-11-23 DIAGNOSIS — R202 Paresthesia of skin: Secondary | ICD-10-CM | POA: Diagnosis not present

## 2015-11-23 DIAGNOSIS — R5383 Other fatigue: Secondary | ICD-10-CM | POA: Diagnosis not present

## 2015-11-23 DIAGNOSIS — G629 Polyneuropathy, unspecified: Secondary | ICD-10-CM | POA: Diagnosis not present

## 2015-11-23 DIAGNOSIS — G20 Parkinson's disease: Secondary | ICD-10-CM | POA: Diagnosis not present

## 2015-11-23 DIAGNOSIS — I499 Cardiac arrhythmia, unspecified: Secondary | ICD-10-CM | POA: Diagnosis not present

## 2015-11-28 ENCOUNTER — Ambulatory Visit: Payer: Medicare Other | Attending: Neurology | Admitting: Physical Therapy

## 2015-11-28 ENCOUNTER — Encounter: Payer: Self-pay | Admitting: Physical Therapy

## 2015-11-28 DIAGNOSIS — R29818 Other symptoms and signs involving the nervous system: Secondary | ICD-10-CM | POA: Insufficient documentation

## 2015-11-28 DIAGNOSIS — R41844 Frontal lobe and executive function deficit: Secondary | ICD-10-CM | POA: Diagnosis not present

## 2015-11-28 DIAGNOSIS — R29898 Other symptoms and signs involving the musculoskeletal system: Secondary | ICD-10-CM | POA: Insufficient documentation

## 2015-11-28 DIAGNOSIS — R293 Abnormal posture: Secondary | ICD-10-CM | POA: Diagnosis not present

## 2015-11-28 DIAGNOSIS — R2689 Other abnormalities of gait and mobility: Secondary | ICD-10-CM | POA: Insufficient documentation

## 2015-11-28 DIAGNOSIS — R278 Other lack of coordination: Secondary | ICD-10-CM | POA: Insufficient documentation

## 2015-11-28 NOTE — Patient Instructions (Addendum)
  Posture exercises Standing at the door frame: Stand with back against the door frame  1. Head retraction- keep chin level and pull head straight back to frame. 2. Shoulder blade squeezes 3. Alteranate Arm raises- Raise arm up towards door frame Hold each for 5 seconds, 5x each, perform 1-2x/day  * Remember to keep head level and shoulder back towards door frame

## 2015-11-28 NOTE — Therapy (Signed)
[facility] [address] [place], [place], [address] Phone: [phone]   Fax:  [phone]  Physical Therapy Treatment  Patient Details  Name: Bradley GlassingGary David Sivley MRN: [id number] Date of Birth: 11/17/1945 Referring Provider: Dr. Anne HahnWillis  Encounter Date: 11/28/2015      PT End of Session - 11/28/15 1405    Visit Number 3   Number of Visits 17   Date for PT Re-Evaluation 01/06/16   Authorization Type Medicare/BCBS secondary   PT Start Time 1323   PT Stop Time 1405   PT Time Calculation (min) 42 min   Activity Tolerance Patient tolerated treatment well   Behavior [facility] Therapy [facility] for tasks assessed/performed      Past Medical History  Diagnosis Date  . Parkinson disease (HCC)   . Focal dystonia   . Camptocormia   . Abnormality of gait 07/11/2015  . Memory disorder 11/09/2015    Past Surgical History  Procedure Laterality Date  . Ankle surgery Right   . Cataract extraction      There were no vitals filed for this visit.      Subjective Assessment - 11/28/15 1327    Subjective Did HEP every other day. Reports feeling exhausted after 3 min of walking for the walking program.   Patient is accompained by: Family member  wife-Patricia   Limitations --  Wife reports difficulty getting in and out of car   Patient Stated Goals Wants to build up muscles to improve posture.   Currently in Pain? Yes   Pain Score 5    Pain Location Back   Pain Descriptors / Indicators Aching   Pain Type Chronic pain   Pain Onset More than a month ago   Pain Frequency Intermittent                         [facility] Adult PT Treatment/Exercise - 11/28/15 0001    Transfers   Stand to Sit 4: Min guard  without UE support progressing to lower surface 2nd set of     Knee/Hip Exercises: Supine   Bridges 5 sets;Strengthening     Performed Postural strengthening as written in HEP (see handout below.) Supervision level  with intermittent UE Support.           PT Education - 11/28/15 1336    Education provided Yes   Education Details Reviewed HEP for standing postural strengthening   Person(s) Educated Patient   Methods Explanation;Demonstration;Handout   Comprehension Verbalized understanding;Returned demonstration;Verbal cues required          PT Short Term Goals - 11/07/15 2105    PT SHORT TERM GOAL #1   Title Pt will perform HEP with family supervision for improved posture, transfers, functional mobility and gait.  TARGET 12/07/15   Time 4   Period Weeks   Status New   PT SHORT TERM GOAL #2   Title Pt will improve 5x sit<>stand transfers to less than or equal to 25 seconds for improved transfer efficiency and safety.   Time 4   Period Weeks   Status New   PT SHORT TERM GOAL #3   Title Pt will improve TUG score to less than or equal to 25 seconds for decreased fall risk.   Time 4   Period Weeks   Status New   PT SHORT TERM GOAL #4   Title Pt will report at least 25% improvement in car transfers, for decreased caregiver burden.  Time 4   Period Weeks   Status New   PT SHORT TERM GOAL #5   Title 3 minute walk test to be assessed, with pt ambulating at least 50 additional ft in 3 minute walk, to demonstrate improved efficiency and safety with gait.   Time 4   Period Weeks   Status New           PT Long Term Goals - 11/07/15 2109    PT LONG TERM GOAL #1   Title Pt/family will verbalize understanding of fall prevention within the home environment.  TARGET 01/06/16   Time 8   Period Weeks   Status New   PT LONG TERM GOAL #2   Title Pt will improve 5x sit<>stand to less than or equal to 20 seconds for improved transfer efficiency and safety.   Time 8   Period Weeks   Status New   PT LONG TERM GOAL #3   Title Pt will improve TUG score to less than or equal 20 seconds for decreased fall risk.   Time 8   Period Weeks   Status New   PT LONG TERM GOAL #4   Title Pt will  improve gait velocity to at least 1.8 ft/sec for decreased fall risk and improved gait efficiency and safety.   Time 8   Period Weeks   Status New   PT LONG TERM GOAL #5   Title Pt will report at least 25% improvement in bed mobility for improved functional mobility.   Time 8   Period Weeks   Status New   Additional Long Term Goals   Additional Long Term Goals Yes   PT LONG TERM GOAL #6   Title Pt will verbalize plans for continued community fitness upon D/C from PT.   Time 8   Period Weeks   Status New               Plan - 11/28/15 1340    Clinical Impression Statement skilled session focused on  HEP review and Le strengthening.  Pt continues to be challenged with HEP and demonstrates significant hip weakness with bridging.   PT Next Visit Plan Hip strengthening, practice car transfers as able; initiate PWR! Moves in sitting and standing;       Patient will benefit from skilled therapeutic intervention in order to improve the following deficits and impairments:     Visit Diagnosis: Other abnormalities of gait and mobility  Abnormal posture     Problem List Patient Active Problem List   Diagnosis Date Noted  . Memory disorder 11/09/2015  . Parkinson disease (HCC) 07/11/2015  . Abnormality of gait 07/11/2015    .Hortencia Conradi, PTA  11/28/2015, 2:09 PM [place] [facility] [address] [place], [place], [address] Phone: [phone]   Fax:  [phone]  Name: Cliford Sequeira MRN: [id number] Date of Birth: Apr 28, 1946

## 2015-11-29 DIAGNOSIS — N529 Male erectile dysfunction, unspecified: Secondary | ICD-10-CM | POA: Diagnosis not present

## 2015-11-29 DIAGNOSIS — B351 Tinea unguium: Secondary | ICD-10-CM | POA: Diagnosis not present

## 2015-11-29 DIAGNOSIS — D649 Anemia, unspecified: Secondary | ICD-10-CM | POA: Diagnosis not present

## 2015-11-29 DIAGNOSIS — L853 Xerosis cutis: Secondary | ICD-10-CM | POA: Diagnosis not present

## 2015-11-29 DIAGNOSIS — G20 Parkinson's disease: Secondary | ICD-10-CM | POA: Diagnosis not present

## 2015-11-29 DIAGNOSIS — G609 Hereditary and idiopathic neuropathy, unspecified: Secondary | ICD-10-CM | POA: Diagnosis not present

## 2015-11-30 ENCOUNTER — Ambulatory Visit: Payer: Medicare Other | Admitting: Physical Therapy

## 2015-11-30 ENCOUNTER — Encounter: Payer: Self-pay | Admitting: Physical Therapy

## 2015-11-30 DIAGNOSIS — R278 Other lack of coordination: Secondary | ICD-10-CM | POA: Diagnosis not present

## 2015-11-30 DIAGNOSIS — R293 Abnormal posture: Secondary | ICD-10-CM | POA: Diagnosis not present

## 2015-11-30 DIAGNOSIS — R2689 Other abnormalities of gait and mobility: Secondary | ICD-10-CM | POA: Diagnosis not present

## 2015-11-30 DIAGNOSIS — R29818 Other symptoms and signs involving the nervous system: Secondary | ICD-10-CM | POA: Diagnosis not present

## 2015-11-30 DIAGNOSIS — R29898 Other symptoms and signs involving the musculoskeletal system: Secondary | ICD-10-CM | POA: Diagnosis not present

## 2015-11-30 DIAGNOSIS — R41844 Frontal lobe and executive function deficit: Secondary | ICD-10-CM | POA: Diagnosis not present

## 2015-11-30 NOTE — Therapy (Signed)
[facility] [address] [place], [place], [address] Phone: [phone]   Fax:  [phone]  Physical Therapy Treatment  Patient Details  Name: Bradley GlassingGary David Fleming MRN: [id number] Date of Birth: 01/25/1946 Referring Provider: Dr. Anne HahnWillis  Encounter Date: 11/30/2015      PT End of Session - 11/30/15 1554    Visit Number 4   Number of Visits 17   Date for PT Re-Evaluation 01/06/16   Authorization Type Medicare/BCBS secondary   PT Start Time 1405   PT Stop Time 1445   PT Time Calculation (min) 40 min   Activity Tolerance Patient tolerated treatment well   Behavior During Therapy [facility] for tasks assessed/performed      Past Medical History  Diagnosis Date  . Parkinson disease (HCC)   . Focal dystonia   . Camptocormia   . Abnormality of gait 07/11/2015  . Memory disorder 11/09/2015    Past Surgical History  Procedure Laterality Date  . Ankle surgery Right   . Cataract extraction      There were no vitals filed for this visit.      Subjective Assessment - 11/30/15 1414    Subjective [facility] of weeks due to wife having multiple appointments.  [facility]   Patient is accompained by: Family member  wife-Patricia   Limitations --  Wife reports difficulty getting in and out of car   Patient Stated Goals Wants to build up muscles to improve posture.   Currently in Pain? Yes   Pain Score 5    Pain Location Back   Pain Descriptors / Indicators Aching   Pain Type Chronic pain   Pain Onset More than a month ago   Pain Frequency Intermittent                         [facility] Adult PT Treatment/Exercise - 11/30/15 0001    Lumbar Exercises: Stretches   Single Knee to Chest Stretch 2 reps;20 seconds   Knee/Hip Exercises: Aerobic   Nustep L=4 10min            PWR [facility]) - 11/30/15 1424    PWR! Up 10x   PWR! Rock 10x   PWR! Twist 10x   PWR! Step 10x   Comments supine; knees bent             PT Education - 11/30/15 1553    Education provided Yes   Education Details Supine PWR! Moves to address deficits with transitional movements   Person(s) Educated Patient   Methods Explanation;Demonstration;Tactile cues;Verbal cues;Handout   Comprehension Verbalized understanding;Need further instruction;Returned demonstration;Verbal cues required;Tactile cues required          PT Short Term Goals - 11/07/15 2105    PT SHORT TERM GOAL #1   Title Pt will perform HEP with family supervision for improved posture, transfers, functional mobility and gait.  TARGET 12/07/15   Time 4   Period Weeks   Status New   PT SHORT TERM GOAL #2   Title Pt will improve 5x sit<>stand transfers to less than or equal to 25 seconds for improved transfer efficiency and safety.   Time 4   Period Weeks   Status New   PT SHORT TERM GOAL #3   Title Pt will improve TUG score to less than or equal to 25 seconds for decreased fall risk.   Time 4  Period Weeks   Status New   PT SHORT TERM GOAL #4   Title Pt will report at least 25% improvement in car transfers, for decreased caregiver burden.   Time 4   Period Weeks   Status New   PT SHORT TERM GOAL #5   Title 3 minute walk test to be assessed, with pt ambulating at least 50 additional ft in 3 minute walk, to demonstrate improved efficiency and safety with gait.   Time 4   Period Weeks   Status New           PT Long Term Goals - 11/07/15 2109    PT LONG TERM GOAL #1   Title Pt/family will verbalize understanding of fall prevention within the home environment.  TARGET 01/06/16   Time 8   Period Weeks   Status New   PT LONG TERM GOAL #2   Title Pt will improve 5x sit<>stand to less than or equal to 20 seconds for improved transfer efficiency and safety.   Time 8   Period Weeks   Status New   PT LONG TERM GOAL #3   Title Pt will improve TUG score to less than or equal 20 seconds for decreased fall  risk.   Time 8   Period Weeks   Status New   PT LONG TERM GOAL #4   Title Pt will improve gait velocity to at least 1.8 ft/sec for decreased fall risk and improved gait efficiency and safety.   Time 8   Period Weeks   Status New   PT LONG TERM GOAL #5   Title Pt will report at least 25% improvement in bed mobility for improved functional mobility.   Time 8   Period Weeks   Status New   Additional Long Term Goals   Additional Long Term Goals Yes   PT LONG TERM GOAL #6   Title Pt will verbalize plans for continued community fitness upon D/C from PT.   Time 8   Period Weeks   Status New               Plan - 11/30/15 1425    Clinical Impression Statement Skilled session focused on strengthening introducing PWR! Moves in supine for upper trunk/ posture strengthening and activity tolerance on nustep.   PT Next Visit Plan Hip strengthening, practice car transfers as able; initiate PWR! Moves in sitting and standing and supine.       Patient will benefit from skilled therapeutic intervention in order to improve the following deficits and impairments:     Visit Diagnosis: Other abnormalities of gait and mobility  Abnormal posture     Problem List Patient Active Problem List   Diagnosis Date Noted  . Memory disorder 11/09/2015  . Parkinson disease (HCC) 07/11/2015  . Abnormality of gait 07/11/2015   Hortencia Conradi, PTA  11/30/2015, 3:56 PM [place] [facility] [address] [place], [place], [address] Phone: [phone]   Fax:  [phone]  Name: Bradley Fleming MRN: [id number] Date of Birth: 02-15-1946

## 2015-12-01 ENCOUNTER — Ambulatory Visit: Payer: Medicare Other | Admitting: Occupational Therapy

## 2015-12-01 DIAGNOSIS — R41844 Frontal lobe and executive function deficit: Secondary | ICD-10-CM

## 2015-12-01 DIAGNOSIS — R29818 Other symptoms and signs involving the nervous system: Secondary | ICD-10-CM

## 2015-12-01 DIAGNOSIS — R29898 Other symptoms and signs involving the musculoskeletal system: Secondary | ICD-10-CM | POA: Diagnosis not present

## 2015-12-01 DIAGNOSIS — R278 Other lack of coordination: Secondary | ICD-10-CM

## 2015-12-01 DIAGNOSIS — F411 Generalized anxiety disorder: Secondary | ICD-10-CM | POA: Diagnosis not present

## 2015-12-01 DIAGNOSIS — R293 Abnormal posture: Secondary | ICD-10-CM | POA: Diagnosis not present

## 2015-12-01 DIAGNOSIS — R2689 Other abnormalities of gait and mobility: Secondary | ICD-10-CM

## 2015-12-01 DIAGNOSIS — F331 Major depressive disorder, recurrent, moderate: Secondary | ICD-10-CM | POA: Diagnosis not present

## 2015-12-01 NOTE — Therapy (Signed)
[facility] [address] [place], [place], [address] Phone: [phone]   Fax:  [phone]  Occupational Therapy Evaluation  Patient Details  Name: Bradley Fleming MRN: [id number] Date of Birth: [birth year] Referring Provider: Dr. Anne Hahn  Encounter Date: 12/01/2015      OT End of Session - 12/01/15 0943    Visit Number 1   Number of Visits 17   Date for OT Re-Evaluation 01/29/16   Authorization Type Medicare   Authorization - Visit Number 1   Authorization - Number of Visits 10   OT Start Time [phone]   OT Stop Time 1015   OT Time Calculation (min) 36 min   Activity Tolerance Patient tolerated treatment well   Behavior During Therapy [facility] for tasks assessed/performed;Flat affect      Past Medical History  Diagnosis Date  . Parkinson disease (HCC)   . Focal dystonia   . Camptocormia   . Abnormality of gait 07/11/2015  . Memory disorder 11/09/2015    Past Surgical History  Procedure Laterality Date  . Ankle surgery Right   . Cataract extraction      There were no vitals filed for this visit.      Subjective Assessment - 12/01/15 1034    Subjective  Pt with PD presents with bradykinesia, decreased coordination, rigidity, abnormal posture, cognitive deficits   Pertinent History see Epic   Patient Stated Goals to stand up straighter   Currently in Pain? Yes   Pain Score 4    Pain Location Back   Pain Descriptors / Indicators Aching   Pain Type Chronic pain   Pain Onset More than a month ago   Pain Frequency Intermittent   Aggravating Factors  malpositioning   Pain Relieving Factors repositioning   Multiple Pain Sites No,  OT will monitor pain yet will not address directly           [facility] OT Assessment - 12/01/15 0001    Assessment   Diagnosis Parkinson's disease   Referring Provider Dr. Anne Hahn   Onset Date --  approx 1 year ago diagnosed   Prior Therapy OT/PT   Precautions   Precautions Fall    Balance Screen   Has the patient fallen in the past 6 months No   Has the patient had a decrease in activity level because of a fear of falling?  No   Is the patient reluctant to leave their home because of a fear of falling?  No   Home  Environment   Family/patient expects to be discharged to: Private residence   Home Access Stairs   Home Layout Two level   Alternate Level Stairs - Number of Steps 12   Bathroom Shower/Tub Walk-in Shower  has shower seat   Lives With Spouse   Prior Function   Level of Independence Independent with basic ADLs;Independent with household mobility with device   Leisure [employer] at [employer] 2-3 times per week   ADL   Eating/Feeding Independent   Grooming Independent   Upper Body Bathing Modified independent  increased time   Lower Body Bathing Modified independent   Upper Body Dressing Increased time;Independent   Lower Body Dressing Increased time;Modified independent   Toilet Tranfer Modified independent  Pt reports difficulty getting up and down at times   Tub/Shower Transfer Modified independent   ADL comments Pt requires increased time. Pt demonstrates camptocormia   IADL   Light Housekeeping --  Washes dishes   Meal  Prep Able to complete simple warm meal prep  cooks breakfast at times   Mobility   Mobility Status --  modified indpendent with rollator   Written Expression   Dominant Hand Right   Handwriting 100% legible  no   Cognition   Overall Cognitive Status Impaired/Different from baseline   Memory Impaired   Memory Impairment Decreased short term memory   Observation/Other Assessments   Other Surveys  Select   Physical Performance Test   Yes   Simulated Eating Time (seconds) 18.68 secs    Simulated Eating Comments Pt eats with let hand   Donning Doffing Jacket Time (seconds) 93.75 secs   Coordination   Gross Motor Movements are Fluid and Coordinated No   Fine Motor Movements are Fluid and Coordinated No   Right 9  Hole Peg Test 39.12 secs   Left 9 Hole Peg Test 59.47 secs   Box and Blocks RUE 32, LUE 33   Coordination impaired left more than right   ROM / Strength   AROM / PROM / Strength AROM   AROM   Overall AROM  Deficits   AROM Assessment Site Shoulder;Elbow;Forearm;Wrist;Finger;Thumb   Right/Left Shoulder Right;Left   Right Shoulder Flexion 105 Degrees   Left Shoulder Flexion 100 Degrees   Right/Left Elbow Right;Left   Right Elbow Flexion --  WFLS   Right Elbow Extension -36   Left Elbow Flexion --  WFLs   Left Elbow Extension -40   Right/Left Forearm Right;Left   Right Forearm Pronation --  WFLs   Right Forearm Supination --  75%   Left Forearm Pronation --  WFLs   Left Forearm Supination --  75%   Right/Left Finger Right   Right Composite Finger Extension --  95%   Right Composite Finger Flexion --  95%   Left Composite Finger Extension --  80%, hyperext at PIP's for ring and middle   Left Composite Finger Flexion --  90%   Right/Left Thumb Left   Left Thumb Opposition --  flex 50-55,very limited movement at thumb unable to extend                                                       OT Short Term Goals - 12/01/15 1134    OT SHORT TERM GOAL #1   Title I with PD specific HEP- check 12/31/15   Time 4   Period Weeks   Status New   OT SHORT TERM GOAL #2   Title Pt will verbalize understanding of adapted strategies for ADLs/ IADLS.   Time 4   Period Weeks   Status New   OT SHORT TERM GOAL #3   Title Pt will demonstrate improved fine motor coordination as evidenced by decreasing RUE 9 hole peg test to 36 secs   Baseline 39.12 secs   Time 4   Period Weeks   Status New   OT SHORT TERM GOAL #4   Title Pt will demonstrate improved fine motor coordination as evidenced by decreasing LUE 9 hole peg test score to 51 secs or less   Baseline 59.47   Time 4   Period Weeks   Status New   OT SHORT TERM GOAL #5   Title Pt will verbalize  understanding of positioning to minimize risk for contracture/ deformity   Time 4  Period Weeks   Status New   Additional Short Term Goals   Additional Short Term Goals Yes   OT SHORT TERM GOAL #6   Title Pt will demonstrate improved bilateral UE functional use as evidenced by increasing bilaterl box/ blocks score by 4 blocks.   Baseline RUE 32, LUE 33   Time 4   Period Weeks   Status New           OT Long Term Goals - 12/01/15 1145    OT LONG TERM GOAL #1   Title Pt will demonstrate increased ease with dressing as evidenced by decreasing PPT#4 to 70 secs or less- check 12/30/15   Baseline 93.75 secs   Time 8   Period Weeks   Status New   OT LONG TERM GOAL #2   Title Pt will demonstrate increased ease with feeding as evidenced by decreasing PPT#2 to 16 secs or less   Baseline 19.68 secs   Time 8   Period Weeks   Status New   OT LONG TERM GOAL #3   Title Pt / family will verbalize understanding of compensatory strategies for short term memory deficits and ways to keep thinking skills sharp.   Time 8   Period Weeks   Status New   OT LONG TERM GOAL #4   Title Pt will verbalize understanding of community resources and ways to prevent future PD-related complications.   Time 8   Period Weeks   Status New   OT LONG TERM GOAL #5   Title Pt will demonstrate ability to retrieve a lightweight object at 110 shoulder flexion with -20 elbow extension with bilateral UE's.   Baseline right shoulder flexion 105, -36 elbow ext, LUE 100 shoulder flexion / -40 elbow extension   Time 8   Period Weeks   Status New   Long Term Additional Goals   Additional Long Term Goals Yes               Plan - 12/01/15 1041    Clinical Impression Statement Pt with Parkinson's disease presents with the following deficits: abnormal posture, decreased coordination, cognitive deficits, rigidity, decreased balance, bradykinesia, and cognitive deficits wheich impede performance of ADLS/IADLs. Pt can  benefit from skilled occupational therapy to maximize safety and indpendence with ADLS/ IADLs.   Rehab Potential Good   OT Frequency 2x / week  plus eval   OT Duration 8 weeks   OT Treatment/Interventions Self-care/ADL training;Therapeutic exercise;Patient/family education;Balance training;Manual Therapy;Neuromuscular education;Ultrasound;Energy conservation;Therapeutic exercises;Therapeutic activities;DME and/or AE instruction;Parrafin;Cryotherapy;Electrical Stimulation;Fluidtherapy;Visual/perceptual remediation/compensation;Passive range of motion;Cognitive remediation/compensation;Contrast Bath;Moist Heat   Plan initiate PD specific HEP   Consulted and Agree with Plan of Care Patient      Patient will benefit from skilled therapeutic intervention in order to improve the following deficits and impairments:  Abnormal gait, Decreased coordination, Decreased range of motion, Difficulty walking, Impaired flexibility, Decreased safety awareness, Decreased endurance, Decreased activity tolerance, Impaired tone, Pain, Impaired UE functional use, Decreased knowledge of use of DME, Decreased balance, Decreased cognition, Decreased mobility, Decreased strength  Visit Diagnosis: Other lack of coordination - Plan: Ot plan of care cert/re-cert  Abnormal posture - Plan: Ot plan of care cert/re-cert  Other abnormalities of gait and mobility - Plan: Ot plan of care cert/re-cert  Other symptoms and signs involving the musculoskeletal system - Plan: Ot plan of care cert/re-cert  Other symptoms and signs involving the nervous system - Plan: Ot plan of care cert/re-cert  Frontal lobe and executive function deficit - Plan: Ot  plan of care cert/re-cert      G-Codes - 12/26/2015 1204    Functional Assessment Tool Used 9 hole peg test RUE 39.12, LUE 59.47, PPT# 4 93.75 secs,    Functional Limitation Self care   Self Care Current Status (Z6109) At least 40 percent but less than 60 percent impaired, limited or  restricted   Self Care Goal Status (U0454) At least 20 percent but less than 40 percent impaired, limited or restricted      Problem List Patient Active Problem List   Diagnosis Date Noted  . Memory disorder 11/09/2015  . Parkinson disease (HCC) 07/11/2015  . Abnormality of gait 07/11/2015    Lanaya Bennis 12/26/15, 12:12 PM Keene Breath, OTR/L [phone] Phone: [phone] 12:12 PM 26-Dec-2015 [facility] [address] [place], [place], [address] Phone: [phone]   Fax:  [phone]  Name: Bradley Fleming MRN: [id number] Date of Birth: Dec 08, 1945

## 2015-12-05 ENCOUNTER — Ambulatory Visit: Payer: Federal, State, Local not specified - PPO | Admitting: Physical Therapy

## 2015-12-05 ENCOUNTER — Encounter: Payer: Federal, State, Local not specified - PPO | Admitting: Occupational Therapy

## 2015-12-05 DIAGNOSIS — G20 Parkinson's disease: Secondary | ICD-10-CM | POA: Diagnosis not present

## 2015-12-07 ENCOUNTER — Ambulatory Visit: Payer: Medicare Other | Admitting: Physical Therapy

## 2015-12-07 DIAGNOSIS — R278 Other lack of coordination: Secondary | ICD-10-CM

## 2015-12-07 DIAGNOSIS — R293 Abnormal posture: Secondary | ICD-10-CM

## 2015-12-07 DIAGNOSIS — R29818 Other symptoms and signs involving the nervous system: Secondary | ICD-10-CM | POA: Diagnosis not present

## 2015-12-07 DIAGNOSIS — R29898 Other symptoms and signs involving the musculoskeletal system: Secondary | ICD-10-CM | POA: Diagnosis not present

## 2015-12-07 DIAGNOSIS — R41844 Frontal lobe and executive function deficit: Secondary | ICD-10-CM | POA: Diagnosis not present

## 2015-12-07 DIAGNOSIS — R2689 Other abnormalities of gait and mobility: Secondary | ICD-10-CM

## 2015-12-07 NOTE — Therapy (Signed)
[facility] [address] [place], [place], [address] Phone: [phone]   Fax:  [phone]  Physical Therapy Treatment  Patient Details  Name: Bradley Fleming MRN: [id number] Date of Birth: [birth year] Referring Provider: Dr. Anne Hahn  Encounter Date: 12/07/2015      PT End of Session - 12/07/15 1444    Visit Number 5   Number of Visits 17   Date for PT Re-Evaluation 01/06/16   Authorization Type Medicare/BCBS secondary   PT Start Time 1413  pt arrived late   PT Stop Time 1442   PT Time Calculation (min) 29 min   Activity Tolerance Patient tolerated treatment well   Behavior During Therapy [facility] for tasks assessed/performed;Flat affect      Past Medical History  Diagnosis Date  . Parkinson disease (HCC)   . Focal dystonia   . Camptocormia   . Abnormality of gait 07/11/2015  . Memory disorder 11/09/2015    Past Surgical History  Procedure Laterality Date  . Ankle surgery Right   . Cataract extraction      There were no vitals filed for this visit.      Subjective Assessment - 12/07/15 1417    Subjective no falls; doing well today   Patient Stated Goals Wants to build up muscles to improve posture.   Currently in Pain? No/denies                         [facility] Adult PT Treatment/Exercise - 12/07/15 1417    Knee/Hip Exercises: Aerobic   Nustep L=5 8 min; 4 extremities   Knee/Hip Exercises: Standing   Heel Raises Both;20 reps   Heel Raises Limitations toe raises x 20   Hip Flexion Both;10 reps;Knee bent   Hip Flexion Limitations alt   Hip Abduction Both;10 reps;Knee straight   Abduction Limitations alt   Hip Extension Both;10 reps;Knee straight   Extension Limitations alt           [facility]) - 12/07/15 1443    PWR! Up x15   PWR! [address] [place]   PWR! Twist x15   PWR! Step x15   Comments min cues for posture and technique               PT Short Term Goals - 11/07/15  2105    PT SHORT TERM GOAL #1   Title Pt will perform HEP with family supervision for improved posture, transfers, functional mobility and gait.  TARGET 12/07/15   Time 4   Period Weeks   Status New   PT SHORT TERM GOAL #2   Title Pt will improve 5x sit<>stand transfers to less than or equal to 25 seconds for improved transfer efficiency and safety.   Time 4   Period Weeks   Status New   PT SHORT TERM GOAL #3   Title Pt will improve TUG score to less than or equal to 25 seconds for decreased fall risk.   Time 4   Period Weeks   Status New   PT SHORT TERM GOAL #4   Title Pt will report at least 25% improvement in car transfers, for decreased caregiver burden.   Time 4   Period Weeks   Status New   PT SHORT TERM GOAL #5   Title 3 minute walk test to be assessed, with pt ambulating at least 50 additional ft in 3 minute walk, to demonstrate improved efficiency and safety with gait.  Time 4   Period Weeks   Status New           PT Long Term Goals - 11/07/15 2109    PT LONG TERM GOAL #1   Title Pt/family will verbalize understanding of fall prevention within the home environment.  TARGET 01/06/16   Time 8   Period Weeks   Status New   PT LONG TERM GOAL #2   Title Pt will improve 5x sit<>stand to less than or equal to 20 seconds for improved transfer efficiency and safety.   Time 8   Period Weeks   Status New   PT LONG TERM GOAL #3   Title Pt will improve TUG score to less than or equal 20 seconds for decreased fall risk.   Time 8   Period Weeks   Status New   PT LONG TERM GOAL #4   Title Pt will improve gait velocity to at least 1.8 ft/sec for decreased fall risk and improved gait efficiency and safety.   Time 8   Period Weeks   Status New   PT LONG TERM GOAL #5   Title Pt will report at least 25% improvement in bed mobility for improved functional mobility.   Time 8   Period Weeks   Status New   Additional Long Term Goals   Additional Long Term Goals Yes   PT LONG  TERM GOAL #6   Title Pt will verbalize plans for continued community fitness upon D/C from PT.   Time 8   Period Weeks   Status New               Plan - 12/07/15 1444    Clinical Impression Statement Pt c/o min back pain after standing exercises (~6 min).  May need seated rest breaks to progress standing activities.  Will continue to benefit from PT to maximize function.   PT Next Visit Plan Hip strengthening, practice car transfers as able; initiate PWR! Moves in sitting and standing and supine.    Consulted and Agree with Plan of Care Patient      Patient will benefit from skilled therapeutic intervention in order to improve the following deficits and impairments:     Visit Diagnosis: Abnormal posture  Other abnormalities of gait and mobility  Other symptoms and signs involving the musculoskeletal system  Other symptoms and signs involving the nervous system  Other lack of coordination     Problem List Patient Active Problem List   Diagnosis Date Noted  . Memory disorder 11/09/2015  . Parkinson disease (HCC) 07/11/2015  . Abnormality of gait 07/11/2015   Clarita CraneStephanie F Levina Boyack, PT, DPT 12/07/2015 2:47 PM  [facility] [address] [place], [place], [address] Phone: [phone]   Fax:  [phone]  Name: Bradley Fleming MRN: [id number] Date of Birth: 01/04/1946

## 2015-12-08 ENCOUNTER — Ambulatory Visit: Payer: Medicare Other | Admitting: Occupational Therapy

## 2015-12-08 ENCOUNTER — Encounter: Payer: Self-pay | Admitting: Physical Therapy

## 2015-12-08 ENCOUNTER — Ambulatory Visit: Payer: Medicare Other | Admitting: Physical Therapy

## 2015-12-08 DIAGNOSIS — R278 Other lack of coordination: Secondary | ICD-10-CM

## 2015-12-08 DIAGNOSIS — R2689 Other abnormalities of gait and mobility: Secondary | ICD-10-CM | POA: Diagnosis not present

## 2015-12-08 DIAGNOSIS — R29898 Other symptoms and signs involving the musculoskeletal system: Secondary | ICD-10-CM

## 2015-12-08 DIAGNOSIS — R293 Abnormal posture: Secondary | ICD-10-CM | POA: Diagnosis not present

## 2015-12-08 DIAGNOSIS — R29818 Other symptoms and signs involving the nervous system: Secondary | ICD-10-CM

## 2015-12-08 DIAGNOSIS — R41844 Frontal lobe and executive function deficit: Secondary | ICD-10-CM | POA: Diagnosis not present

## 2015-12-08 NOTE — Patient Instructions (Signed)
  Coordination Activities  Perform the following activities for 20 minutes 1 times per day with both hand(s).   Rotate ball in fingertips (clockwise and counter-clockwise).  Toss ball between hands.  Flip cards 1 at a time as fast as you can.  Deal cards with your thumb (Hold deck in hand and push card off top with thumb for right hand, push the cards off of deck on the table with your left thumb).  Pick up coins and place in container or coin bank.  Pick up coins and stack.  Pick up coins one at a time until you get 5-10 in your hand, then move coins from palm to fingertips to stack one at a time.  Practice writing and/or typing.  Screw together nuts and bolts, then unfasten.`

## 2015-12-08 NOTE — Therapy (Signed)
[facility] [address] [place], [place], [address] Phone: [phone]   Fax:  [phone]  Occupational Therapy Treatment  Patient Details  Name: Ferd GlassingGary David Vicario MRN: [id number] Date of Birth: 06/22/1946 Referring Provider: Dr. Anne HahnWillis  Encounter Date: 12/08/2015      OT End of Session - 12/08/15 1500    Visit Number 2   Number of Visits 17   Date for OT Re-Evaluation 01/29/16   Authorization Type Medicare   Authorization - Visit Number 2   Authorization - Number of Visits 10   OT Start Time 1450   OT Stop Time 1530   OT Time Calculation (min) 40 min   Activity Tolerance Patient tolerated treatment well   Behavior During Therapy [facility] for tasks assessed/performed      Past Medical History  Diagnosis Date  . Parkinson disease (HCC)   . Focal dystonia   . Camptocormia   . Abnormality of gait 07/11/2015  . Memory disorder 11/09/2015    Past Surgical History  Procedure Laterality Date  . Ankle surgery Right   . Cataract extraction      There were no vitals filed for this visit.                   Pt was instructed in coordination HEP, see pt instructions, mod v.c/ min difficulty with RUE, mod difficulty with LUE.Marland Kitchen. And demonstration PWR! Hands prior to task.  Buddy strap applied to ring and small finger during therapy. Therapist to wait to issue buddy strap when pt's wife is able to attend therapy.         OT Education - 12/08/15 1509    Education provided Yes   Education Details Coordination HEP   Person(s) Educated Patient   Methods Explanation;Demonstration;Verbal cues;Tactile cues;Handout   Comprehension Verbalized understanding;Returned demonstration;Verbal cues required  Needs reinforcement          OT Short Term Goals - 12/01/15 1134    OT SHORT TERM GOAL #1   Title I with PD specific HEP- check 12/31/15   Time 4   Period Weeks   Status New   OT SHORT TERM GOAL #2   Title Pt will verbalize understanding of adapted strategies for ADLs/ IADLS.   Time 4   Period Weeks   Status New   OT SHORT TERM GOAL #3   Title Pt will demonstrate improved fine motor coordination as evidenced by decreasing RUE 9 hole peg test to 36 secs   Baseline 39.12 secs   Time 4   Period Weeks   Status New   OT SHORT TERM GOAL #4   Title Pt will demonstrate improved fine motor coordination as evidenced by decreasing LUE 9 hole peg test score to 51 secs or less   Baseline 59.47   Time 4   Period Weeks   Status New   OT SHORT TERM GOAL #5   Title Pt will verbalize understanding of positioning to minimize risk for contracture/ deformity   Time 4   Period Weeks   Status New   Additional Short Term Goals   Additional Short Term Goals Yes   OT SHORT TERM GOAL #6   Title Pt will demonstrate improved bilateral UE functional use as evidenced by increasing bilaterl box/ blocks score by 4 blocks.   Baseline RUE 32, LUE 33   Time 4   Period Weeks   Status New           OT  Long Term Goals - 12/01/15 1145    OT LONG TERM GOAL #1   Title Pt will demonstrate increased ease with dressing as evidenced by decreasing PPT#4 to 70 secs or less- check 12/30/15   Baseline 93.75 secs   Time 8   Period Weeks   Status New   OT LONG TERM GOAL #2   Title Pt will demonstrate increased ease with feeding as evidenced by decreasing PPT#2 to 16 secs or less   Baseline 19.68 secs   Time 8   Period Weeks   Status New   OT LONG TERM GOAL #3   Title Pt / family will verbalize understanding of compensatory strategies for short term memory deficits and ways to keep thinking skills sharp.   Time 8   Period Weeks   Status New   OT LONG TERM GOAL #4   Title Pt will verbalize understanding of community resources and ways to prevent future PD-related complications.   Time 8   Period Weeks   Status New   OT LONG TERM GOAL #5   Title Pt will demonstrate ability to retrieve a lightweight object at  110 shoulder flexion with -20 elbow extension with bilateral UE's.   Baseline right shoulder flexion 105, -36 elbow ext, LUE 100 shoulder flexion / -40 elbow extension   Time 8   Period Weeks   Status New   Long Term Additional Goals   Additional Long Term Goals Yes               Plan - 12/08/15 1458    Clinical Impression Statement Pt is progressing towards goals for coordination. Pt verbalizes understanding of HEP, yet he can benefit from reinforcement.   Rehab Potential Good   OT Frequency 2x / week   OT Duration 8 weeks   OT Treatment/Interventions Self-care/ADL training;Therapeutic exercise;Patient/family education;Balance training;Manual Therapy;Neuromuscular education;Ultrasound;Energy conservation;Therapeutic exercises;Therapeutic activities;DME and/or AE instruction;Parrafin;Cryotherapy;Electrical Stimulation;Fluidtherapy;Visual/perceptual remediation/compensation;Passive range of motion;Cognitive remediation/compensation;Contrast Bath;Moist Heat   Plan reinforce coordination HEP, adapted strategies for ADLS.   OT Home Exercise Plan issued coordination 12/08/15   Consulted and Agree with Plan of Care Patient      Patient will benefit from skilled therapeutic intervention in order to improve the following deficits and impairments:  Abnormal gait, Decreased coordination, Decreased range of motion, Difficulty walking, Impaired flexibility, Decreased safety awareness, Decreased endurance, Decreased activity tolerance, Impaired tone, Pain, Impaired UE functional use, Decreased knowledge of use of DME, Decreased balance, Decreased cognition, Decreased mobility, Decreased strength  Visit Diagnosis: Other lack of coordination  Other abnormalities of gait and mobility  Other symptoms and signs involving the musculoskeletal system    Problem List Patient Active Problem List   Diagnosis Date Noted  . Memory disorder 11/09/2015  . Parkinson disease (HCC) 07/11/2015  .  Abnormality of gait 07/11/2015    RINE,KATHRYN 12/08/2015, 3:14 PM Keene Breath, OTR/L [phone] Phone: [phone] 3:14 PM 12/08/2015 [facility] [address] [place], [place], [address] Phone: [phone]   Fax:  [phone]  Name: Keyonte Cookston MRN: [id number] Date of Birth: January 15, 1946

## 2015-12-11 NOTE — Therapy (Signed)
[facility] [address] [place], [place], [address] Phone: [phone]   Fax:  [phone]  Physical Therapy Treatment  Patient Details  Name: Bradley Fleming MRN: [id number] Date of Birth: [birth year] Referring Provider: Dr. Jannifer Franklin  Encounter Date: 12/08/2015   12/08/15 1415  PT Visits / Re-Eval  Visit Number 6  Number of Visits 17  Date for PT Re-Evaluation 01/06/16  Authorization  Authorization Type Medicare/BCBS secondary  PT Time Calculation  PT Start Time 1414 (pt late for appt)  PT Stop Time 1445  PT Time Calculation (min) 31 min  PT - End of Session  Activity Tolerance Patient tolerated treatment well  Behavior During Therapy [facility] for tasks assessed/performed;Flat affect     Past Medical History  Diagnosis Date  . Parkinson disease ([place])   . Focal dystonia   . Camptocormia   . Abnormality of gait 07/11/2015  . Memory disorder 11/09/2015    Past Surgical History  Procedure Laterality Date  . Ankle surgery Right   . Cataract extraction      There were no vitals filed for this visit.    12/08/15 1413  Symptoms/Limitations  Subjective No new complaints. No falls to report .  Limitations (spouse reports increased difficulty getting in/out car)  Patient Stated Goals Wants to build up muscles to improve posture.  Pain Assessment  Currently in Pain? Yes  Pain Score 5  Pain Location Back  Pain Descriptors / Indicators Aching;Sore  Pain Type Chronic pain  Pain Onset More than a month ago  Aggravating Factors  poor posture, increased activity  Pain Relieving Factors rest      12/08/15 1420  Transfers  Transfers Sit to Stand;Stand to Sit  Five time sit to stand comments  21.44 (no hands used)  Ambulation/Gait  Ambulation/Gait Yes  Ambulation/Gait Assistance 5: Supervision  Ambulation/Gait Assistance Details 370 in 3 minute walk test with supervision, mild shortness of breath reported  afterwards. cues to stay close to rollator with gait.   Ambulation Distance (Feet) 370 Feet (for 3 minute walk;430 total)  Assistive device Rollator  Gait Pattern Step-through pattern;Decreased stride length;Trunk flexed  Ambulation Surface Level;Indoor  Timed Up and Go Test  Normal TUG (seconds) 18.81 (with rollator)  TUG Normal TUG         PT Short Term Goals - 12/08/15 1415    PT SHORT TERM GOAL #1   Title Pt will perform HEP with family supervision for improved posture, transfers, functional mobility and gait.  TARGET 12/07/15   Time --   Period --   Status On-going   PT SHORT TERM GOAL #2   Title Pt will improve 5x sit<>stand transfers to less than or equal to 25 seconds for improved transfer efficiency and safety.   Baseline 12/08/15:21.44 sec's without UE support    Status Achieved   PT SHORT TERM GOAL #3   Title Pt will improve TUG score to less than or equal to 25 seconds for decreased fall risk.   Baseline 12/08/15: 18.81 today with use of rollator   Status Achieved   PT SHORT TERM GOAL #4   Title Pt will report at least 25% improvement in car transfers, for decreased caregiver burden.   Baseline 12/08/15: pt reports it depends on how he is feeling on that day, some days are better, other it is just a difficult as when he started.   Status Partially Met   PT SHORT TERM GOAL #5   Title 3  minute walk test to be assessed, with pt ambulating at least 50 additional ft in 3 minute walk, to demonstrate improved efficiency and safety with gait.   Baseline 12/08/15: 370 feet today with rollator, increase of 40 feet from intial testing   Status Not Met             PT Long Term Goals - 11/07/15 2109    PT LONG TERM GOAL #1   Title Pt/family will verbalize understanding of fall prevention within the home environment.  TARGET 01/06/16   Time 8   Period Weeks   Status New   PT LONG TERM GOAL #2   Title Pt will improve 5x sit<>stand to less than or equal to 20 seconds for  improved transfer efficiency and safety.   Time 8   Period Weeks   Status New   PT LONG TERM GOAL #3   Title Pt will improve TUG score to less than or equal 20 seconds for decreased fall risk.   Time 8   Period Weeks   Status New   PT LONG TERM GOAL #4   Title Pt will improve gait velocity to at least 1.8 ft/sec for decreased fall risk and improved gait efficiency and safety.   Time 8   Period Weeks   Status New   PT LONG TERM GOAL #5   Title Pt will report at least 25% improvement in bed mobility for improved functional mobility.   Time 8   Period Weeks   Status New   Additional Long Term Goals   Additional Long Term Goals Yes   PT LONG TERM GOAL #6   Title Pt will verbalize plans for continued community fitness upon D/C from PT.   Time 8   Period Weeks   Status New        12/08/15 1415  Plan  Clinical Impression Statement Pt has met some STGs to date and is progressing toward all others.   Pt will benefit from skilled therapeutic intervention in order to improve on the following deficits Abnormal gait;Decreased activity tolerance;Decreased balance;Decreased mobility;Decreased endurance;Decreased strength;Difficulty walking;Postural dysfunction;Pain  Rehab Potential Good  PT Frequency 2x / week  PT Duration 8 weeks (plus eval)  PT Treatment/Interventions ADLs/Self Care Home Management;Therapeutic exercise;Therapeutic activities;Functional mobility training;Gait training;DME Instruction;Balance training;Neuromuscular re-education;Patient/family education  PT Next Visit Plan Assess remaining STGs;Hip strengthening, practice car transfers as able; initiate PWR! Moves in sitting and standing and supine.   Consulted and Agree with Plan of Care Patient;Family member/caregiver  Family Member Consulted wife        Patient will benefit from skilled therapeutic intervention in order to improve the following deficits and impairments:  Abnormal gait, Decreased activity tolerance,  Decreased balance, Decreased mobility, Decreased endurance, Decreased strength, Difficulty walking, Postural dysfunction, Pain  Visit Diagnosis: Other abnormalities of gait and mobility  Other symptoms and signs involving the musculoskeletal system  Other symptoms and signs involving the nervous system  Other lack of coordination     Problem List Patient Active Problem List   Diagnosis Date Noted  . Memory disorder 11/09/2015  . Parkinson disease ([place]) 07/11/2015  . Abnormality of gait 07/11/2015    Willow Ora, PTA, [facility] [address], [place], [place] [address] [phone] 12/11/2015, 11:29 PM   Name: Aldyn Toon MRN: [id number] Date of Birth: Apr 12, 1946

## 2015-12-13 ENCOUNTER — Ambulatory Visit: Payer: Medicare Other | Admitting: Occupational Therapy

## 2015-12-13 ENCOUNTER — Ambulatory Visit: Payer: Medicare Other | Admitting: Physical Therapy

## 2015-12-13 DIAGNOSIS — R29898 Other symptoms and signs involving the musculoskeletal system: Secondary | ICD-10-CM

## 2015-12-13 DIAGNOSIS — R293 Abnormal posture: Secondary | ICD-10-CM

## 2015-12-13 DIAGNOSIS — R29818 Other symptoms and signs involving the nervous system: Secondary | ICD-10-CM

## 2015-12-13 DIAGNOSIS — R278 Other lack of coordination: Secondary | ICD-10-CM

## 2015-12-13 DIAGNOSIS — R41844 Frontal lobe and executive function deficit: Secondary | ICD-10-CM | POA: Diagnosis not present

## 2015-12-13 DIAGNOSIS — R2689 Other abnormalities of gait and mobility: Secondary | ICD-10-CM | POA: Diagnosis not present

## 2015-12-13 NOTE — Therapy (Signed)
[facility] [address] [place], [place], [address] Phone: [phone]   Fax:  [phone]  Occupational Therapy Treatment  Patient Details  Name: Bradley GlassingGary David Stammen MRN: [id number] Date of Birth: 12/19/1945 Referring Provider: Dr. Anne HahnWillis  Encounter Date: 12/13/2015      OT End of Session - 12/13/15 1332    Visit Number 3   Number of Visits 17   Date for OT Re-Evaluation 01/29/16   Authorization Type Medicare   Authorization - Visit Number 3   Authorization - Number of Visits 10   OT Start Time 1318   OT Stop Time 1400   OT Time Calculation (min) 42 min   Activity Tolerance Patient tolerated treatment well   Behavior [facility] Therapy [facility] for tasks assessed/performed      Past Medical History  Diagnosis Date  . Parkinson disease ([facility])   . Focal dystonia   . Camptocormia   . Abnormality of gait 07/11/2015  . Memory disorder 11/09/2015    Past Surgical History  Procedure Laterality Date  . Ankle surgery Right   . Cataract extraction      There were no vitals filed for this visit.      Subjective Assessment - 12/13/15 1331    Subjective  Pt reports that the exercises have been "refreshing"   Pertinent History see Epic   Patient Stated Goals to stand up straighter   Currently in Pain? No/denies               Dealing cards with thumb with each hand, min-mod v. And tactile cueing for R hand and mod cueing/facilitation for L hand.  Functional reach to grasp/release large diameter cylinder object with L hand to facilitate MP ext of digits/thumb with min cueing and intermittent min facilitation.                OT Education - 12/13/15 1410    Education Details Reviewed Coordination HEP issued last session   Person(s) Educated Patient   Methods Explanation;Demonstration   Comprehension Verbalized understanding;Returned demonstration;Verbal cues required;Tactile cues required;Need further  instruction  min-mod cueing          OT Short Term Goals - 12/01/15 1134    OT SHORT TERM GOAL #1   Title I with PD specific HEP- check 12/31/15   Time 4   Period Weeks   Status New   OT SHORT TERM GOAL #2   Title Pt will verbalize understanding of adapted strategies for ADLs/ IADLS.   Time 4   Period Weeks   Status New   OT SHORT TERM GOAL #3   Title Pt will demonstrate improved fine motor coordination as evidenced by decreasing RUE 9 hole peg test to 36 secs   Baseline 39.12 secs   Time 4   Period Weeks   Status New   OT SHORT TERM GOAL #4   Title Pt will demonstrate improved fine motor coordination as evidenced by decreasing LUE 9 hole peg test score to 51 secs or less   Baseline 59.47   Time 4   Period Weeks   Status New   OT SHORT TERM GOAL #5   Title Pt will verbalize understanding of positioning to minimize risk for contracture/ deformity   Time 4   Period Weeks   Status New   Additional Short Term Goals   Additional Short Term Goals Yes   OT SHORT TERM GOAL #6   Title Pt will demonstrate improved bilateral UE  functional use as evidenced by increasing bilaterl box/ blocks score by 4 blocks.   Baseline RUE 32, LUE 33   Time 4   Period Weeks   Status New           OT Long Term Goals - 12/01/15 1145    OT LONG TERM GOAL #1   Title Pt will demonstrate increased ease with dressing as evidenced by decreasing PPT#4 to 70 secs or less- check 12/30/15   Baseline 93.75 secs   Time 8   Period Weeks   Status New   OT LONG TERM GOAL #2   Title Pt will demonstrate increased ease with feeding as evidenced by decreasing PPT#2 to 16 secs or less   Baseline 19.68 secs   Time 8   Period Weeks   Status New   OT LONG TERM GOAL #3   Title Pt / family will verbalize understanding of compensatory strategies for short term memory deficits and ways to keep thinking skills sharp.   Time 8   Period Weeks   Status New   OT LONG TERM GOAL #4   Title Pt will verbalize  understanding of community resources and ways to prevent future PD-related complications.   Time 8   Period Weeks   Status New   OT LONG TERM GOAL #5   Title Pt will demonstrate ability to retrieve a lightweight object at 110 shoulder flexion with -20 elbow extension with bilateral UE's.   Baseline right shoulder flexion 105, -36 elbow ext, LUE 100 shoulder flexion / -40 elbow extension   Time 8   Period Weeks   Status New   Long Term Additional Goals   Additional Long Term Goals Yes               Plan - 12/13/15 1346    Clinical Impression Statement Pt is progressing towards goals for coordination.  Pt needs cueing for large amplitude movements (verbal and tactile).   Rehab Potential Good   Clinical Impairments Affecting Rehab Potential cognitive deficits   OT Frequency 2x / week   OT Duration 8 weeks   OT Treatment/Interventions Self-care/ADL training;Therapeutic exercise;Patient/family education;Balance training;Manual Therapy;Neuromuscular education;Ultrasound;Energy conservation;Therapeutic exercises;Therapeutic activities;DME and/or AE instruction;Parrafin;Cryotherapy;Electrical Stimulation;Fluidtherapy;Visual/perceptual remediation/compensation;Passive range of motion;Cognitive remediation/compensation;Contrast Bath;Moist Heat   Plan ?resting hand splint or soft short thumb spica, strategies for ADLs   OT Home Exercise Plan issued coordination 12/08/15   Consulted and Agree with Plan of Care Patient      Patient will benefit from skilled therapeutic intervention in order to improve the following deficits and impairments:  Abnormal gait, Decreased coordination, Decreased range of motion, Difficulty walking, Impaired flexibility, Decreased safety awareness, Decreased endurance, Decreased activity tolerance, Impaired tone, Pain, Impaired UE functional use, Decreased knowledge of use of DME, Decreased balance, Decreased cognition, Decreased mobility, Decreased strength  Visit  Diagnosis: Other symptoms and signs involving the musculoskeletal system  Other lack of coordination  Other symptoms and signs involving the nervous system    Problem List Patient Active Problem List   Diagnosis Date Noted  . Memory disorder 11/09/2015  . Parkinson disease ([facility]) 07/11/2015  . Abnormality of gait 07/11/2015    [facility] 12/13/2015, 2:11 PM  [facility] [address] [place], [place], [address] Phone: [phone]   Fax:  [phone]  Name: Mendy Lapinsky MRN: [id number] Date of Birth: May 19, 1946  Willa Frater, OTR/L [facility] [address] [place], [place]  [address] [phone] phone [phone]  12/13/2015 2:11 PM

## 2015-12-14 ENCOUNTER — Ambulatory Visit: Payer: Medicare Other | Admitting: Occupational Therapy

## 2015-12-14 DIAGNOSIS — R41844 Frontal lobe and executive function deficit: Secondary | ICD-10-CM | POA: Diagnosis not present

## 2015-12-14 DIAGNOSIS — R278 Other lack of coordination: Secondary | ICD-10-CM | POA: Diagnosis not present

## 2015-12-14 DIAGNOSIS — R293 Abnormal posture: Secondary | ICD-10-CM | POA: Diagnosis not present

## 2015-12-14 DIAGNOSIS — R29818 Other symptoms and signs involving the nervous system: Secondary | ICD-10-CM | POA: Diagnosis not present

## 2015-12-14 DIAGNOSIS — R29898 Other symptoms and signs involving the musculoskeletal system: Secondary | ICD-10-CM | POA: Diagnosis not present

## 2015-12-14 DIAGNOSIS — R2689 Other abnormalities of gait and mobility: Secondary | ICD-10-CM | POA: Diagnosis not present

## 2015-12-14 NOTE — Therapy (Signed)
[facility] [address] [place], [place], [address] Phone: [phone]   Fax:  [phone]  Physical Therapy Treatment  Patient Details  Name: Bradley Fleming MRN: [id number] Date of Birth: [birth year] Referring Provider: Dr. Anne Hahn  Encounter Date: 12/13/2015      PT End of Session - 12/14/15 1040    Visit Number 7   Number of Visits 17   Date for PT Re-Evaluation 01/06/16   Authorization Type Medicare/BCBS secondary   PT Start Time 1154  Pt arrived late   PT Stop Time 1233   PT Time Calculation (min) 39 min   Activity Tolerance Patient tolerated treatment well   Behavior During Therapy [facility] for tasks assessed/performed;Flat affect      Past Medical History  Diagnosis Date  . Parkinson disease ([facility])   . Focal dystonia   . Camptocormia   . Abnormality of gait 07/11/2015  . Memory disorder 11/09/2015    Past Surgical History  Procedure Laterality Date  . Ankle surgery Right   . Cataract extraction      There were no vitals filed for this visit.      Subjective Assessment - 12/13/15 1155    Subjective No complaints, no falls.   Patient Stated Goals Wants to build up muscles to improve posture.   Currently in Pain? Yes   Pain Score 5    Pain Location Back   Pain Orientation Lower   Pain Descriptors / Indicators Aching;Sore   Pain Type Chronic pain   Pain Onset More than a month ago   Pain Frequency Intermittent   Aggravating Factors  sitting straight, walking with straight posture   Pain Relieving Factors rest, lying down                         [facility] Adult PT Treatment/Exercise - 12/14/15 0001    Transfers   Transfers Sit to Stand;Stand to Sit   Sit to Stand 5: Supervision;From elevated surface;With upper extremity assist   Stand to Sit 5: Supervision;To elevated surface;With upper extremity assist   Ambulation/Gait   Ambulation/Gait Yes   Ambulation/Gait Assistance 5:  Supervision   Ambulation/Gait Assistance Details Short distance gait in gym area to and from SciFit stepper machine; cues provided for upright posture and increased step length with gait and to stay closer within walker's BOS.   Assistive device Rollator   Ambulation Surface Level;Indoor   Exercises   Exercises Other Exercises   Other Exercises  Postural exercises review at doorframe:  neck retraction x 5 reps, scapular retraction x 10 reps-pt requires min verbal cueing for optimal technique   Knee/Hip Exercises: Aerobic   Nustep Seated SciFit Stepper, 4 extremities, x 6 minutes, with cues for RPM >50-60  30 sec bout of 70 RPM    Cues provided throughout Seated Stepper (at 1.5 level workload) to increase intensity of movement   Neuro Re-education:     PWR [facility]) - 12/13/15 1212    PWR! exercises Moves in sitting   PWR! Up x 10  cues provided for best positioning for scapular retraction   PWR! Rock x 10   PWR! Twist  x 10  modified axial trunk rotation in sitting   PWR! Step x 10  marching x 10, then transition step x 10   Comments Cues for upright posture, cues for large amplitude movement patterns      PWR! Moves performed/reviewed in supine:  PWR! Up x  5 reps for posture through shoulders, PWR! Rock x 10 reps with cues for reaching and pushing through heels to increase momentum, PWR! Twist x 10 reps with cues for deliberate start/stop movements; PWR! Step (modified as hooklying marching) x 10 reps for improved initial transition step.        PT Education - 12/14/15 1039    Education provided Yes   Education Details Review of HEP-Supine PWR! Moves and postural exercises   Person(s) Educated Patient   Methods Explanation;Demonstration   Comprehension Returned demonstration;Verbalized understanding;Verbal cues required          PT Short Term Goals - 12/14/15 1041    PT SHORT TERM GOAL #1   Title Pt will perform HEP with family supervision for improved posture, transfers,  functional mobility and gait.  TARGET 12/07/15   Status Partially Met   PT SHORT TERM GOAL #2   Title Pt will improve 5x sit<>stand transfers to less than or equal to 25 seconds for improved transfer efficiency and safety.   Baseline 12/08/15:21.44 sec's without UE support    Status Achieved   PT SHORT TERM GOAL #3   Title Pt will improve TUG score to less than or equal to 25 seconds for decreased fall risk.   Baseline 12/08/15: 18.81 today with use of rollator   Status Achieved   PT SHORT TERM GOAL #4   Title Pt will report at least 25% improvement in car transfers, for decreased caregiver burden.   Baseline 12/08/15: pt reports it depends on how he is feeling on that day, some days are better, other it is just a difficult as when he started.   Status Partially Met   PT SHORT TERM GOAL #5   Title 3 minute walk test to be assessed, with pt ambulating at least 50 additional ft in 3 minute walk, to demonstrate improved efficiency and safety with gait.   Baseline 12/08/15: 370 feet today with rollator, increase of 40 feet from intial testing   Status Not Met           PT Long Term Goals - 12/14/15 1041    PT LONG TERM GOAL #1   Title Pt/family will verbalize understanding of fall prevention within the home environment.  TARGET 01/06/16   Time 8   Period Weeks   Status On-going   PT LONG TERM GOAL #2   Title Pt will improve 5x sit<>stand to less than or equal to 20 seconds for improved transfer efficiency and safety.   Time 8   Period Weeks   Status On-going   PT LONG TERM GOAL #3   Title Pt will improve TUG score to less than or equal 20 seconds for decreased fall risk.   Time 8   Period Weeks   Status On-going   PT LONG TERM GOAL #4   Title Pt will improve gait velocity to at least 1.8 ft/sec for decreased fall risk and improved gait efficiency and safety.   Time 8   Period Weeks   Status On-going   PT LONG TERM GOAL #5   Title Pt will report at least 25% improvement in bed  mobility for improved functional mobility.   Time 8   Period Weeks   Status On-going   PT LONG TERM GOAL #6   Title Pt will verbalize plans for continued community fitness upon D/C from PT.   Time 8   Period Weeks   Status On-going  Plan - 12/14/15 1041    Clinical Impression Statement Pt has partially met STG #1 for HEP-he appears to be performing HEP some at home, but he needs cues for appropriate technique and intensity of movement.  By end of session, pt is ambulating with more erect/upright posture at rollator.  Pt will continue to benefit from further skilled PT to address balance,posture, strengthening and gait.     Rehab Potential Good   PT Frequency 2x / week   PT Duration 8 weeks  plus eval   PT Treatment/Interventions ADLs/Self Care Home Management;Therapeutic exercise;Therapeutic activities;Functional mobility training;Gait training;DME Instruction;Balance training;Neuromuscular re-education;Patient/family education   PT Next Visit Plan Hip strengthening, practice car transfers as able as well as bed mobility; initiate PWR! Moves in sitting and standing as part of HEP-work on intensity of movement    Consulted and Agree with Plan of Care Patient;Family member/caregiver   Family Member Consulted wife      Patient will benefit from skilled therapeutic intervention in order to improve the following deficits and impairments:  Abnormal gait, Decreased activity tolerance, Decreased balance, Decreased mobility, Decreased endurance, Decreased strength, Difficulty walking, Postural dysfunction, Pain  Visit Diagnosis: Other abnormalities of gait and mobility  Abnormal posture     Problem List Patient Active Problem List   Diagnosis Date Noted  . Memory disorder 11/09/2015  . Parkinson disease ([place]) 07/11/2015  . Abnormality of gait 07/11/2015    MARRIOTT,AMY W. 12/14/2015, 10:44 AM  Frazier Butt., PT  [facility] [address] [place], [place], [address] Phone: [phone]   Fax:  [phone]  Name: Nicolaos Mitrano MRN: [id number] Date of Birth: 02/17/1946

## 2015-12-14 NOTE — Patient Instructions (Addendum)
Wear your thumb splint only during the daytime for 2-3 hrs at a time. Remove splint and check for pressure areas or redness.If pressure areas, redness or splint feels too tight, remove splint. Do not wear splint while bathing.

## 2015-12-15 NOTE — Therapy (Signed)
[facility] [address] [place], [place], [address] Phone: [phone]   Fax:  [phone]  Occupational Therapy Treatment  Patient Details  Name: Bradley GlassingGary David Fleming MRN: [id number] Date of Birth: 10/22/1945 Referring Provider: Dr. Anne HahnWillis  Encounter Date: 12/14/2015      OT End of Session - 12/14/15 1736    Visit Number 4   Number of Visits 17   Date for OT Re-Evaluation 01/29/16   Authorization Type Medicare   Authorization - Visit Number 4   Authorization - Number of Visits 10   OT Start Time 1533   OT Stop Time 1615   OT Time Calculation (min) 42 min      Past Medical History  Diagnosis Date  . Parkinson disease (HCC)   . Focal dystonia   . Camptocormia   . Abnormality of gait 07/11/2015  . Memory disorder 11/09/2015    Past Surgical History  Procedure Laterality Date  . Ankle surgery Right   . Cataract extraction      There were no vitals filed for this visit.       Pt was issued a neoprene thumb splint for improved positioning. Therapist reviewed application and wear with pt. Pt performed flipping and dealing cards while wearing splint with improved performance, min -mod v.c. For large amplitude movements.                PWR [facility] - 12/14/15 1739    PWR! exercises Moves in supine   PWR! Up 10x   PWR! Rock 10x   PWR! Twist 10x   PWR! Step 6x   Comments min-mod v.c./ tacticle cues               OT Short Term Goals - 12/01/15 1134    OT SHORT TERM GOAL #1   Title I with PD specific HEP- check 12/31/15   Time 4   Period Weeks   Status New   OT SHORT TERM GOAL #2   Title Pt will verbalize understanding of adapted strategies for ADLs/ IADLS.   Time 4   Period Weeks   Status New   OT SHORT TERM GOAL #3   Title Pt will demonstrate improved fine motor coordination as evidenced by decreasing RUE 9 hole peg test to 36 secs   Baseline 39.12 secs   Time 4   Period Weeks    Status New   OT SHORT TERM GOAL #4   Title Pt will demonstrate improved fine motor coordination as evidenced by decreasing LUE 9 hole peg test score to 51 secs or less   Baseline 59.47   Time 4   Period Weeks   Status New   OT SHORT TERM GOAL #5   Title Pt will verbalize understanding of positioning to minimize risk for contracture/ deformity   Time 4   Period Weeks   Status New   Additional Short Term Goals   Additional Short Term Goals Yes   OT SHORT TERM GOAL #6   Title Pt will demonstrate improved bilateral UE functional use as evidenced by increasing bilaterl box/ blocks score by 4 blocks.   Baseline RUE 32, LUE 33   Time 4   Period Weeks   Status New           OT Long Term Goals - 12/01/15 1145    OT LONG TERM GOAL #1   Title Pt will demonstrate increased ease with dressing as evidenced by decreasing PPT#4 to 70  secs or less- check 12/30/15   Baseline 93.75 secs   Time 8   Period Weeks   Status New   OT LONG TERM GOAL #2   Title Pt will demonstrate increased ease with feeding as evidenced by decreasing PPT#2 to 16 secs or less   Baseline 19.68 secs   Time 8   Period Weeks   Status New   OT LONG TERM GOAL #3   Title Pt / family will verbalize understanding of compensatory strategies for short term memory deficits and ways to keep thinking skills sharp.   Time 8   Period Weeks   Status New   OT LONG TERM GOAL #4   Title Pt will verbalize understanding of community resources and ways to prevent future PD-related complications.   Time 8   Period Weeks   Status New   OT LONG TERM GOAL #5   Title Pt will demonstrate ability to retrieve a lightweight object at 110 shoulder flexion with -20 elbow extension with bilateral UE's.   Baseline right shoulder flexion 105, -36 elbow ext, LUE 100 shoulder flexion / -40 elbow extension   Time 8   Period Weeks   Status New   Long Term Additional Goals   Additional Long Term Goals Yes               Plan - 12/14/15  1740    Clinical Impression Statement Pt is progress ing towards goals. Pt demonstrates improved left thumb positioning with soft CMC splint.   Rehab Potential Good   Clinical Impairments Affecting Rehab Potential cognitive deficits   OT Frequency 2x / week   OT Duration 8 weeks   OT Treatment/Interventions Self-care/ADL training;Therapeutic exercise;Patient/family education;Balance training;Manual Therapy;Neuromuscular education;Ultrasound;Energy conservation;Therapeutic exercises;Therapeutic activities;DME and/or AE instruction;Parrafin;Cryotherapy;Electrical Stimulation;Fluidtherapy;Visual/perceptual remediation/compensation;Passive range of motion;Cognitive remediation/compensation;Contrast Bath;Moist Heat   Plan strategies for ADLs   OT Home Exercise Plan issued coordination 12/08/15   Consulted and Agree with Plan of Care Patient      Patient will benefit from skilled therapeutic intervention in order to improve the following deficits and impairments:  Abnormal gait, Decreased coordination, Decreased range of motion, Difficulty walking, Impaired flexibility, Decreased safety awareness, Decreased endurance, Decreased activity tolerance, Impaired tone, Pain, Impaired UE functional use, Decreased knowledge of use of DME, Decreased balance, Decreased cognition, Decreased mobility, Decreased strength  Visit Diagnosis: Other symptoms and signs involving the musculoskeletal system  Other lack of coordination  Other symptoms and signs involving the nervous system  Frontal lobe and executive function deficit    Problem List Patient Active Problem List   Diagnosis Date Noted  . Memory disorder 11/09/2015  . Parkinson disease (HCC) 07/11/2015  . Abnormality of gait 07/11/2015    Bradley Fleming 12/15/2015, 12:08 PM Bradley Fleming, OTR/L [phone] Phone: [phone] 12:08 PM 12/15/2015 [facility] [address] [place], [place], [address] Phone: [phone]   Fax:  [phone]  Name: Bradley Fleming MRN: [id number] Date of Birth: 1946/07/30

## 2015-12-19 ENCOUNTER — Encounter: Payer: Self-pay | Admitting: Physical Therapy

## 2015-12-19 ENCOUNTER — Ambulatory Visit: Payer: Medicare Other | Admitting: Occupational Therapy

## 2015-12-19 ENCOUNTER — Ambulatory Visit: Payer: Medicare Other | Admitting: Physical Therapy

## 2015-12-19 DIAGNOSIS — R293 Abnormal posture: Secondary | ICD-10-CM | POA: Diagnosis not present

## 2015-12-19 DIAGNOSIS — R41844 Frontal lobe and executive function deficit: Secondary | ICD-10-CM | POA: Diagnosis not present

## 2015-12-19 DIAGNOSIS — R278 Other lack of coordination: Secondary | ICD-10-CM

## 2015-12-19 DIAGNOSIS — R29818 Other symptoms and signs involving the nervous system: Secondary | ICD-10-CM

## 2015-12-19 DIAGNOSIS — R2689 Other abnormalities of gait and mobility: Secondary | ICD-10-CM | POA: Diagnosis not present

## 2015-12-19 DIAGNOSIS — R29898 Other symptoms and signs involving the musculoskeletal system: Secondary | ICD-10-CM

## 2015-12-19 NOTE — Therapy (Signed)
[facility] [address] [place], [place], [address] Phone: [phone]   Fax:  [phone]  Physical Therapy Treatment  Patient Details  Name: Bradley Fleming MRN: [id number] Date of Birth: Mar 25, 1946 Referring Provider: Dr. Jannifer Franklin  Encounter Date: 12/19/2015      PT End of Session - 12/19/15 1438    Visit Number 8   Number of Visits 17   Date for PT Re-Evaluation 01/06/16   Authorization Type Medicare/BCBS secondary   PT Start Time 1244  pt arrived late.   PT Stop Time 1315   PT Time Calculation (min) 31 min   Activity Tolerance Patient tolerated treatment well   Behavior During Therapy WFL for tasks assessed/performed;Flat affect      Past Medical History  Diagnosis Date  . Parkinson disease ([place])   . Focal dystonia   . Camptocormia   . Abnormality of gait 07/11/2015  . Memory disorder 11/09/2015    Past Surgical History  Procedure Laterality Date  . Ankle surgery Right   . Cataract extraction      There were no vitals filed for this visit.      Subjective Assessment - 12/19/15 1246    Subjective Reports doing HEP at home. Planning to go back to silver sneakers after Memorial day. Able to get in/out of car but it's still challenging getting his feet in.   Currently in Pain? No/denies   Pain Onset More than a month ago                         [facility] Adult PT Treatment/Exercise - 12/19/15 0001    Transfers   Transfers Sit to Stand;Stand to Sit   Sit to Stand 5: Supervision   Sit to Stand Details Other (comment)  5x2 with UE support and 2x5 wit for stengthening and......Marland Kitchen   Lumbar Exercises: Stretches   Prone Mid Back Stretch 1 rep;60 seconds  lying prone to stretch trunk and abdomen   Lumbar Exercises: Supine   Other Supine Lumbar Exercises Alterante UE extension with 5 second holds for trunk stretch   Lumbar Exercises: Quadruped   Straight Leg Raise 1 second;10 reps  x2                 PT Education - 12/19/15 1436    Education provided Yes   Education Details How to lie prone to increase trunk and abdomen stretch.   Person(s) Educated Patient   Methods Explanation;Verbal cues;Tactile cues   Comprehension Verbalized understanding;Returned demonstration;Verbal cues required;Need further instruction          PT Short Term Goals - 12/14/15 1041    PT SHORT TERM GOAL #1   Title Pt will perform HEP with family supervision for improved posture, transfers, functional mobility and gait.  TARGET 12/07/15   Status Partially Met   PT SHORT TERM GOAL #2   Title Pt will improve 5x sit<>stand transfers to less than or equal to 25 seconds for improved transfer efficiency and safety.   Baseline 12/08/15:21.44 sec's without UE support    Status Achieved   PT SHORT TERM GOAL #3   Title Pt will improve TUG score to less than or equal to 25 seconds for decreased fall risk.   Baseline 12/08/15: 18.81 today with use of rollator   Status Achieved   PT SHORT TERM GOAL #4   Title Pt will report at least 25% improvement in car transfers, for decreased caregiver  burden.   Baseline 12/08/15: pt reports it depends on how he is feeling on that day, some days are better, other it is just a difficult as when he started.   Status Partially Met   PT SHORT TERM GOAL #5   Title 3 minute walk test to be assessed, with pt ambulating at least 50 additional ft in 3 minute walk, to demonstrate improved efficiency and safety with gait.   Baseline 12/08/15: 370 feet today with rollator, increase of 40 feet from intial testing   Status Not Met           PT Long Term Goals - 12/14/15 1041    PT LONG TERM GOAL #1   Title Pt/family will verbalize understanding of fall prevention within the home environment.  TARGET 01/06/16   Time 8   Period Weeks   Status On-going   PT LONG TERM GOAL #2   Title Pt will improve 5x sit<>stand to less than or equal to 20 seconds for improved transfer  efficiency and safety.   Time 8   Period Weeks   Status On-going   PT LONG TERM GOAL #3   Title Pt will improve TUG score to less than or equal 20 seconds for decreased fall risk.   Time 8   Period Weeks   Status On-going   PT LONG TERM GOAL #4   Title Pt will improve gait velocity to at least 1.8 ft/sec for decreased fall risk and improved gait efficiency and safety.   Time 8   Period Weeks   Status On-going   PT LONG TERM GOAL #5   Title Pt will report at least 25% improvement in bed mobility for improved functional mobility.   Time 8   Period Weeks   Status On-going   PT LONG TERM GOAL #6   Title Pt will verbalize plans for continued community fitness upon D/C from PT.   Time 8   Period Weeks   Status On-going               Plan - 12/19/15 1439    Clinical Impression Statement Skilled session focused on addressing trunk/ abdominal stretching in prone and supine and LE strengthening in  quadruped and sit<>stands.  Pt is progressing with sit<>stands able to perform without UE support.   Rehab Potential Good   PT Frequency 2x / week   PT Duration 8 weeks  plus eval   PT Treatment/Interventions ADLs/Self Care Home Management;Therapeutic exercise;Therapeutic activities;Functional mobility training;Gait training;DME Instruction;Balance training;Neuromuscular re-education;Patient/family education   PT Next Visit Plan Hip strengthening, practice car transfers as able as well as bed mobility; initiate PWR! Moves in sitting and standing as part of HEP-work on intensity of movement    Consulted and Agree with Plan of Care Patient;Family member/caregiver   Family Member Consulted wife      Patient will benefit from skilled therapeutic intervention in order to improve the following deficits and impairments:  Abnormal gait, Decreased activity tolerance, Decreased balance, Decreased mobility, Decreased endurance, Decreased strength, Difficulty walking, Postural dysfunction,  Pain  Visit Diagnosis: Other symptoms and signs involving the musculoskeletal system     Problem List Patient Active Problem List   Diagnosis Date Noted  . Memory disorder 11/09/2015  . Parkinson disease ([place]) 07/11/2015  . Abnormality of gait 07/11/2015   Bjorn Loser, PTA  12/19/2015, 2:43 PM [facility] [address] [place], [place], [address] Phone: [phone]   Fax:  [phone]  Name: Bradley Fleming MRN: [id number] Date of Birth: 10-06-1945

## 2015-12-19 NOTE — Therapy (Signed)
[facility] [address] [place], [place], [address] Phone: [phone]   Fax:  [phone]  Occupational Therapy Treatment  Patient Details  Name: Bradley Fleming MRN: [id number] Date of Birth: Jan 31, 1946 Referring Provider: Dr. Anne Hahn  Encounter Date: 12/19/2015      OT End of Session - 12/19/15 1322    Visit Number 5   Number of Visits 17   Date for OT Re-Evaluation 01/29/16   Authorization Type Medicare   Authorization - Visit Number 5   Authorization - Number of Visits 10   OT Start Time 1318   OT Stop Time 1400   OT Time Calculation (min) 42 min   Activity Tolerance Patient tolerated treatment well   Behavior During Therapy [facility] for tasks assessed/performed;Flat affect      Past Medical History  Diagnosis Date  . Parkinson disease (HCC)   . Focal dystonia   . Camptocormia   . Abnormality of gait 07/11/2015  . Memory disorder 11/09/2015    Past Surgical History  Procedure Laterality Date  . Ankle surgery Right   . Cataract extraction      There were no vitals filed for this visit.      Subjective Assessment - 12/19/15 1320    Subjective  Pt reports that splint has been doing well.     Pertinent History see Epic   Patient Stated Goals to stand up straighter   Currently in Pain? No/denies        Self Care:    Dressing:   Practiced buttoning/unbuttoning shirt on table top with min-mod cues for use of PWR! Hands prior to buttoning and use of deliberate/large amplitude movements after instruction.  Pt demo improvement with repetition and use of large amplitude movements.  Practiced donning/doffing jacket using large amplitude movement strategies after initial instruction.  Recommended pt perform in sitting.  Pt demo improvement with repetition and use/min-mod  cues for large amplitude movements.   Simulated ADLs with bag with focus/mod cues for large amplitude movements:  Donning/doffing  pull-over shirt, donning/doffing pants, pulling bag into hand for clothing adjustment/donning socks.                         OT Short Term Goals - 12/01/15 1134    OT SHORT TERM GOAL #1   Title I with PD specific HEP- check 12/31/15   Time 4   Period Weeks   Status New   OT SHORT TERM GOAL #2   Title Pt will verbalize understanding of adapted strategies for ADLs/ IADLS.   Time 4   Period Weeks   Status New   OT SHORT TERM GOAL #3   Title Pt will demonstrate improved fine motor coordination as evidenced by decreasing RUE 9 hole peg test to 36 secs   Baseline 39.12 secs   Time 4   Period Weeks   Status New   OT SHORT TERM GOAL #4   Title Pt will demonstrate improved fine motor coordination as evidenced by decreasing LUE 9 hole peg test score to 51 secs or less   Baseline 59.47   Time 4   Period Weeks   Status New   OT SHORT TERM GOAL #5   Title Pt will verbalize understanding of positioning to minimize risk for contracture/ deformity   Time 4   Period Weeks   Status New   Additional Short Term Goals   Additional Short Term Goals Yes  OT SHORT TERM GOAL #6   Title Pt will demonstrate improved bilateral UE functional use as evidenced by increasing bilaterl box/ blocks score by 4 blocks.   Baseline RUE 32, LUE 33   Time 4   Period Weeks   Status New           OT Long Term Goals - 12/01/15 1145    OT LONG TERM GOAL #1   Title Pt will demonstrate increased ease with dressing as evidenced by decreasing PPT#4 to 70 secs or less- check 12/30/15   Baseline 93.75 secs   Time 8   Period Weeks   Status New   OT LONG TERM GOAL #2   Title Pt will demonstrate increased ease with feeding as evidenced by decreasing PPT#2 to 16 secs or less   Baseline 19.68 secs   Time 8   Period Weeks   Status New   OT LONG TERM GOAL #3   Title Pt / family will verbalize understanding of compensatory strategies for short term memory deficits and ways to keep thinking skills  sharp.   Time 8   Period Weeks   Status New   OT LONG TERM GOAL #4   Title Pt will verbalize understanding of community resources and ways to prevent future PD-related complications.   Time 8   Period Weeks   Status New   OT LONG TERM GOAL #5   Title Pt will demonstrate ability to retrieve a lightweight object at 110 shoulder flexion with -20 elbow extension with bilateral UE's.   Baseline right shoulder flexion 105, -36 elbow ext, LUE 100 shoulder flexion / -40 elbow extension   Time 8   Period Weeks   Status New   Long Term Additional Goals   Additional Long Term Goals Yes               Plan - 12/19/15 1708    Clinical Impression Statement Pt is progressing towards goals with improving movement amplitude.  Pt demo improved ability to don/doff jacket with cueing.   Clinical Impairments Affecting Rehab Potential cognitive deficits   OT Frequency 2x / week   OT Duration 8 weeks   OT Treatment/Interventions Self-care/ADL training;Therapeutic exercise;Patient/family education;Balance training;Manual Therapy;Neuromuscular education;Ultrasound;Energy conservation;Therapeutic exercises;Therapeutic activities;DME and/or AE instruction;Parrafin;Cryotherapy;Electrical Stimulation;Fluidtherapy;Visual/perceptual remediation/compensation;Passive range of motion;Cognitive remediation/compensation;Contrast Bath;Moist Heat   Plan strategies for ADLs (LB dressing, buttoning ? button hook), check splint (pt instructed to bring in)   OT Home Exercise Plan issued coordination 12/08/15   Consulted and Agree with Plan of Care Patient      Patient will benefit from skilled therapeutic intervention in order to improve the following deficits and impairments:  Abnormal gait, Decreased coordination, Decreased range of motion, Difficulty walking, Impaired flexibility, Decreased safety awareness, Decreased endurance, Decreased activity tolerance, Impaired tone, Pain, Impaired UE functional use, Decreased  knowledge of use of DME, Decreased balance, Decreased cognition, Decreased mobility, Decreased strength  Visit Diagnosis: Other symptoms and signs involving the musculoskeletal system  Other lack of coordination  Other symptoms and signs involving the nervous system  Frontal lobe and executive function deficit  Other abnormalities of gait and mobility  Abnormal posture    Problem List Patient Active Problem List   Diagnosis Date Noted  . Memory disorder 11/09/2015  . Parkinson disease (HCC) 07/11/2015  . Abnormality of gait 07/11/2015    [facility] HospitalFREEMAN,Angelo Caroll 12/19/2015, 5:12 PM  [facility] [address] [place], [place], [address] Phone: [phone]   Fax:  [phone]  Name: Bradley Fleming MRN: [id number] Date of Birth: 01/20/1946  Willa Frater, OTR/L [facility] [address] [place], [place]  [address] [phone] phone [phone] 12/19/2015 5:12 PM

## 2015-12-20 ENCOUNTER — Ambulatory Visit: Payer: Federal, State, Local not specified - PPO | Admitting: Physical Therapy

## 2015-12-21 ENCOUNTER — Ambulatory Visit: Payer: Medicare Other | Admitting: Occupational Therapy

## 2015-12-21 ENCOUNTER — Ambulatory Visit: Payer: Medicare Other | Admitting: Physical Therapy

## 2015-12-22 ENCOUNTER — Ambulatory Visit: Payer: Federal, State, Local not specified - PPO | Admitting: Physical Therapy

## 2015-12-23 DIAGNOSIS — K5909 Other constipation: Secondary | ICD-10-CM | POA: Diagnosis not present

## 2015-12-28 ENCOUNTER — Ambulatory Visit: Payer: Medicare Other | Admitting: Occupational Therapy

## 2015-12-28 ENCOUNTER — Ambulatory Visit: Payer: Medicare Other | Admitting: Physical Therapy

## 2015-12-29 ENCOUNTER — Encounter: Payer: Self-pay | Admitting: Occupational Therapy

## 2015-12-29 ENCOUNTER — Ambulatory Visit: Payer: Federal, State, Local not specified - PPO | Admitting: Physical Therapy

## 2015-12-29 NOTE — Therapy (Signed)
[facility] [address] [place], [place], [address] Phone: [phone]   Fax:  [phone]  Patient Details  Name: Bradley Fleming MRN: [id number] Date of Birth: 12-02-1945 Referring Provider:  No ref. provider found  Encounter Date: 12/29/2015      OT Short Term Goals - 12/01/15 1134    OT SHORT TERM GOAL #1   Title I with PD specific HEP- check 12/31/15   Time 4   Period Weeks   Status Partially met-issued but not re-checked   OT SHORT TERM GOAL #2   Title Pt will verbalize understanding of adapted strategies for ADLs/ IADLS.   Time 4   Period Weeks   Status Partially met, however pt could benefit from reinforcement   OT SHORT TERM GOAL #3   Title Pt will demonstrate improved fine motor coordination as evidenced by decreasing RUE 9 hole peg test to 36 secs   Baseline 39.12 secs   Time 4   Period Weeks   Status Not tes   OT SHORT TERM GOAL #4   Title Pt will demonstrate improved fine motor coordination as evidenced by decreasing LUE 9 hole peg test score to 51 secs or less   Baseline 59.47   Time 4   Period Weeks   Status Not tested   OT SHORT TERM GOAL #5   Title Pt will verbalize understanding of positioning to minimize risk for contracture/ deformity   Time 4   Period Weeks   Status Partially  met, soft thumb splint issued   Additional Short Term Goals   Additional Short Term Goals Yes   OT SHORT TERM GOAL #6   Title Pt will demonstrate improved bilateral UE functional use as evidenced by increasing bilateral box/ blocks score by 4 blocks.   Baseline RUE 32, LUE 33   Time 4   Period Weeks   Status Not tested         OT Long Term Goals - 12/01/15 1145    OT LONG TERM GOAL #1   Title Pt will demonstrate increased ease with dressing as evidenced by decreasing PPT#4 to 70 secs or less- check 12/30/15   Baseline 93.75 secs   Time 8   Period Weeks   Status Not tested   OT LONG TERM GOAL #2   Title Pt  will demonstrate increased ease with feeding as evidenced by decreasing PPT#2 to 16 secs or less   Baseline 19.68 secs   Time 8   Period Weeks   Status Not tested   OT LONG TERM GOAL #3   Title Pt / family will verbalize understanding of compensatory strategies for short term memory deficits and ways to keep thinking skills sharp.   Time 8   Period Weeks   Status Not met, not completed   OT LONG TERM GOAL #4   Title Pt will verbalize understanding of community resources and ways to prevent future PD-related complications.   Time 8   Period Weeks   Status Not met   OT LONG TERM GOAL #5   Title Pt will demonstrate ability to retrieve a lightweight object at 110 shoulder flexion with -20 elbow extension with bilateral UE's.   Baseline right shoulder flexion 105, -36 elbow ext, LUE 100 shoulder flexion / -40 elbow extension   Time 8   Period Weeks   Status Not tested   Long Term Additional Goals   Additional Long Term Goals Yes    OCCUPATIONAL THERAPY  DISCHARGE SUMMARY  Visits from Start of Care: 5  Current functional level related to goals / functional outcomes: Pt progress was not reasessed as pt called and cancelled remaining appointments reporting he does not have transportation and his wife is sick.   Remaining deficits: Abnormal posture, bradykinesia, decreased balance, decreased coordination, rigidity, weakness   Education / Equipment: Pt education regarding coordination HEP and adapted strategies for ADLs was initiated. Pt education was not completed as pt did not return.  Plan: Patient agrees to discharge.  Patient goals were not met. Patient is being discharged due to the patient's request.  ?????      Bradley Fleming 12/29/2015, 4:21 PM Theone Murdoch, OTR/L [phone] Phone: [phone] 4:24 PM 12/29/2015 [facility] [address] [place], [place], [address] Phone: [phone]   Fax:   [phone]

## 2016-01-02 ENCOUNTER — Ambulatory Visit: Payer: Federal, State, Local not specified - PPO | Admitting: Physical Therapy

## 2016-01-04 ENCOUNTER — Ambulatory Visit: Payer: Federal, State, Local not specified - PPO | Admitting: Physical Therapy

## 2016-01-04 ENCOUNTER — Encounter: Payer: Federal, State, Local not specified - PPO | Admitting: Occupational Therapy

## 2016-01-05 ENCOUNTER — Ambulatory Visit: Payer: Federal, State, Local not specified - PPO | Admitting: Physical Therapy

## 2016-01-09 ENCOUNTER — Ambulatory Visit: Payer: Federal, State, Local not specified - PPO | Admitting: Physical Therapy

## 2016-01-09 ENCOUNTER — Encounter: Payer: Federal, State, Local not specified - PPO | Admitting: Occupational Therapy

## 2016-01-09 DIAGNOSIS — D649 Anemia, unspecified: Secondary | ICD-10-CM | POA: Diagnosis not present

## 2016-01-09 DIAGNOSIS — G20 Parkinson's disease: Secondary | ICD-10-CM | POA: Diagnosis not present

## 2016-01-09 DIAGNOSIS — R739 Hyperglycemia, unspecified: Secondary | ICD-10-CM | POA: Diagnosis not present

## 2016-01-11 ENCOUNTER — Ambulatory Visit: Payer: Federal, State, Local not specified - PPO | Admitting: Physical Therapy

## 2016-01-11 ENCOUNTER — Encounter: Payer: Federal, State, Local not specified - PPO | Admitting: Occupational Therapy

## 2016-01-13 DIAGNOSIS — F331 Major depressive disorder, recurrent, moderate: Secondary | ICD-10-CM | POA: Diagnosis not present

## 2016-01-13 DIAGNOSIS — F411 Generalized anxiety disorder: Secondary | ICD-10-CM | POA: Diagnosis not present

## 2016-01-16 ENCOUNTER — Ambulatory Visit: Payer: Federal, State, Local not specified - PPO | Admitting: Physical Therapy

## 2016-01-16 ENCOUNTER — Encounter: Payer: Federal, State, Local not specified - PPO | Admitting: Occupational Therapy

## 2016-01-17 ENCOUNTER — Encounter: Payer: Self-pay | Admitting: Physical Therapy

## 2016-01-17 NOTE — Therapy (Signed)
[facility] [address] [place], [place], [address] Phone: [phone]   Fax:  [phone]  Patient Details  Name: Bradley Fleming MRN: [id number] Date of Birth: Aug 17, 1945 Referring Provider:  No ref. provider found  Encounter Date: 01/17/2016   PHYSICAL THERAPY DISCHARGE SUMMARY  Visits from Start of Care: 8 (last date seen 12/19/15)  Pt called to cancel remaining visits on 12/27/15 due to wife sick and no transportation.  Current functional level related to goals / functional outcomes:     PT Short Term Goals - 12/14/15 1041    PT SHORT TERM GOAL #1   Title Pt will perform HEP with family supervision for improved posture, transfers, functional mobility and gait.  TARGET 12/07/15   Status Partially Met   PT SHORT TERM GOAL #2   Title Pt will improve 5x sit<>stand transfers to less than or equal to 25 seconds for improved transfer efficiency and safety.   Baseline 12/08/15:21.44 sec's without UE support    Status Achieved   PT SHORT TERM GOAL #3   Title Pt will improve TUG score to less than or equal to 25 seconds for decreased fall risk.   Baseline 12/08/15: 18.81 today with use of rollator   Status Achieved   PT SHORT TERM GOAL #4   Title Pt will report at least 25% improvement in car transfers, for decreased caregiver burden.   Baseline 12/08/15: pt reports it depends on how he is feeling on that day, some days are better, other it is just a difficult as when he started.   Status Partially Met   PT SHORT TERM GOAL #5   Title 3 minute walk test to be assessed, with pt ambulating at least 50 additional ft in 3 minute walk, to demonstrate improved efficiency and safety with gait.   Baseline 12/08/15: 370 feet today with rollator, increase of 40 feet from intial testing   Status Not Met      Long term goals not able to be fully assessed due to pt not returning after 12/19/15 visit. Remaining deficits: Bradykinesia,  posture, balance, fatigue   Education / Equipment: Educated in [place]: Patient agrees to discharge.  Patient goals were partially met. Patient is being discharged due to not returning since the last visit.  ?????      MARRIOTT,AMY W. 01/17/2016, 9:01 AM Frazier Butt., PT [facility] [address] [place], [place], [address] Phone: [phone]   Fax:  [phone]

## 2016-01-19 ENCOUNTER — Encounter: Payer: Federal, State, Local not specified - PPO | Admitting: Occupational Therapy

## 2016-01-19 ENCOUNTER — Ambulatory Visit: Payer: Federal, State, Local not specified - PPO | Admitting: Physical Therapy

## 2016-01-23 ENCOUNTER — Encounter: Payer: Federal, State, Local not specified - PPO | Admitting: Occupational Therapy

## 2016-01-23 ENCOUNTER — Ambulatory Visit: Payer: Federal, State, Local not specified - PPO | Admitting: Physical Therapy

## 2016-01-26 ENCOUNTER — Ambulatory Visit: Payer: Federal, State, Local not specified - PPO | Admitting: Physical Therapy

## 2016-01-26 ENCOUNTER — Encounter: Payer: Federal, State, Local not specified - PPO | Admitting: Occupational Therapy

## 2016-01-27 DIAGNOSIS — W19XXXA Unspecified fall, initial encounter: Secondary | ICD-10-CM | POA: Diagnosis not present

## 2016-01-27 DIAGNOSIS — S0083XA Contusion of other part of head, initial encounter: Secondary | ICD-10-CM | POA: Diagnosis not present

## 2016-01-30 ENCOUNTER — Ambulatory Visit: Payer: Federal, State, Local not specified - PPO | Admitting: Physical Therapy

## 2016-01-30 ENCOUNTER — Encounter: Payer: Federal, State, Local not specified - PPO | Admitting: Occupational Therapy

## 2016-02-01 ENCOUNTER — Encounter: Payer: Federal, State, Local not specified - PPO | Admitting: Occupational Therapy

## 2016-02-01 ENCOUNTER — Ambulatory Visit: Payer: Federal, State, Local not specified - PPO | Admitting: Physical Therapy

## 2016-02-06 ENCOUNTER — Ambulatory Visit: Payer: Federal, State, Local not specified - PPO | Admitting: Physical Therapy

## 2016-02-06 ENCOUNTER — Encounter: Payer: Federal, State, Local not specified - PPO | Admitting: Occupational Therapy

## 2016-02-07 ENCOUNTER — Encounter (HOSPITAL_COMMUNITY): Payer: Self-pay | Admitting: Emergency Medicine

## 2016-02-07 DIAGNOSIS — G20 Parkinson's disease: Secondary | ICD-10-CM | POA: Insufficient documentation

## 2016-02-07 DIAGNOSIS — Z79899 Other long term (current) drug therapy: Secondary | ICD-10-CM | POA: Diagnosis not present

## 2016-02-07 DIAGNOSIS — I959 Hypotension, unspecified: Secondary | ICD-10-CM | POA: Diagnosis not present

## 2016-02-07 DIAGNOSIS — Z87891 Personal history of nicotine dependence: Secondary | ICD-10-CM | POA: Diagnosis not present

## 2016-02-07 DIAGNOSIS — R4182 Altered mental status, unspecified: Secondary | ICD-10-CM | POA: Diagnosis present

## 2016-02-07 DIAGNOSIS — F329 Major depressive disorder, single episode, unspecified: Secondary | ICD-10-CM | POA: Diagnosis not present

## 2016-02-07 DIAGNOSIS — Z9181 History of falling: Secondary | ICD-10-CM | POA: Diagnosis not present

## 2016-02-07 LAB — COMPREHENSIVE METABOLIC PANEL
ALT: 24 U/L (ref 17–63)
AST: 19 U/L (ref 15–41)
Albumin: 3.6 g/dL (ref 3.5–5.0)
Alkaline Phosphatase: 71 U/L (ref 38–126)
Anion gap: 6 (ref 5–15)
BILIRUBIN TOTAL: 0.8 mg/dL (ref 0.3–1.2)
BUN: 18 mg/dL (ref 6–20)
CHLORIDE: 104 mmol/L (ref 101–111)
CO2: 29 mmol/L (ref 22–32)
CREATININE: 0.73 mg/dL (ref 0.61–1.24)
Calcium: 9.2 mg/dL (ref 8.9–10.3)
Glucose, Bld: 125 mg/dL — ABNORMAL HIGH (ref 65–99)
POTASSIUM: 4.2 mmol/L (ref 3.5–5.1)
Sodium: 139 mmol/L (ref 135–145)
TOTAL PROTEIN: 6.6 g/dL (ref 6.5–8.1)

## 2016-02-07 LAB — CBC
HEMATOCRIT: 37.8 % — AB (ref 39.0–52.0)
HEMOGLOBIN: 12 g/dL — AB (ref 13.0–17.0)
MCH: 25.1 pg — ABNORMAL LOW (ref 26.0–34.0)
MCHC: 31.7 g/dL (ref 30.0–36.0)
MCV: 79.1 fL (ref 78.0–100.0)
Platelets: 226 10*3/uL (ref 150–400)
RBC: 4.78 MIL/uL (ref 4.22–5.81)
RDW: 14.4 % (ref 11.5–15.5)
WBC: 3.9 10*3/uL — AB (ref 4.0–10.5)

## 2016-02-07 LAB — URINALYSIS, ROUTINE W REFLEX MICROSCOPIC
Bilirubin Urine: NEGATIVE
Glucose, UA: NEGATIVE mg/dL
Hgb urine dipstick: NEGATIVE
Ketones, ur: 15 mg/dL — AB
LEUKOCYTES UA: NEGATIVE
NITRITE: NEGATIVE
PH: 5.5 (ref 5.0–8.0)
Protein, ur: NEGATIVE mg/dL
SPECIFIC GRAVITY, URINE: 1.028 (ref 1.005–1.030)

## 2016-02-07 NOTE — ED Notes (Signed)
[facility]. Per wife pt has been "listless" for 2 days and acting more tired and having a "weak look in his eye." Pt had fall in June at a restaurant and had contusion on right side. Pt has history of parkinson disease. Pt is alert and oriented x4. Denies any pain.

## 2016-02-08 ENCOUNTER — Encounter: Payer: Federal, State, Local not specified - PPO | Admitting: Occupational Therapy

## 2016-02-08 ENCOUNTER — Ambulatory Visit: Payer: Federal, State, Local not specified - PPO | Admitting: Physical Therapy

## 2016-02-08 ENCOUNTER — Emergency Department (HOSPITAL_COMMUNITY): Payer: Medicare Other

## 2016-02-08 ENCOUNTER — Emergency Department (HOSPITAL_COMMUNITY)
Admission: EM | Admit: 2016-02-08 | Discharge: 2016-02-08 | Discharge status: Against Medical Advice | Payer: Medicare Other | Attending: Emergency Medicine | Admitting: Emergency Medicine

## 2016-02-08 DIAGNOSIS — F329 Major depressive disorder, single episode, unspecified: Secondary | ICD-10-CM | POA: Diagnosis not present

## 2016-02-08 DIAGNOSIS — F32A Depression, unspecified: Secondary | ICD-10-CM

## 2016-02-08 NOTE — ED Notes (Signed)
Patients wife states patient has been depressed because he can't stand up straight. He has seen specialists per wife. Patient answering all questions and following commands well. States he wants to find out if his back can be fixed.

## 2016-02-08 NOTE — ED Notes (Signed)
Pt family member came to desk and states that she "has to go get some rest" and that the dr was made aware that they "needed to go".

## 2016-02-08 NOTE — ED Notes (Signed)
Pt and wife expressed their desire to leave without any further examination or treatment. Informed both as to risks of leaving AMA. Obtained signature from pt, accepting and understanding said risks, up to and including death. Encouraged pt and wife to return if pt's s/s became worse or developed new, concerning s/s. Pt departed with wife, in NAD, refusing wheelchair.

## 2016-02-08 NOTE — ED Provider Notes (Signed)
CSN: [id number]     Arrival date & time 02/07/16  2155 History  By signing my name below, I, Bradley Fleming, attest that this documentation has been prepared under the direction and in the presence of [name], MD. Electronically Signed: [name], ED Scribe. 02/08/2016. 3:02 AM.      Chief Complaint  Patient presents with  . Altered Mental Status   The history is provided by the patient and the spouse. No language interpreter was used.   HPI Comments: Bradley Fleming is a 70 y.o. male with PMHx of parkinson disease and camptocormia who presents to the Emergency Department for abnormal vital signs at urgent care earlier today. Wife states he was evaluated at urgent care today. She states that she was told that he had tachycardia and hypotension. Wife states that the last 2 days he has had mental status change described as acting "listless and his eyes was rolling back." She also reports that he had a recent fall on 01/27/15. Wife states that she thinks he may be depressed due to his Parkinson disease and not being able to stand up straight. She states that he sometimes does not eat for several days. Patient was initially taken to urgent care for this reason. Wife states that she think's he is "just not cooperating." She reports that she recently had surgery and "he wants to be the center of attention."  Patient is alert and oriented 3. Pt states that he would like to have his back evaluated. Pt is complaining that he is not able to stand up straight. Pt states that he would like to see if a surgeon can do anything for his back.  Past Medical History  Diagnosis Date  . Parkinson disease (HCC)   . Focal dystonia   . Camptocormia   . Abnormality of gait 07/11/2015  . Memory disorder 11/09/2015   Past Surgical History  Procedure Laterality Date  . Ankle surgery Right   . Cataract extraction     Family History  Problem Relation Age of Onset  . Heart failure Mother   .  Cirrhosis Father   . Alcohol abuse Father   . Tremor Sister   . COPD Brother   . Diabetes Brother   . Heart failure Brother    Social History  Substance Use Topics  . Smoking status: Former Games developer  . Smokeless tobacco: Never Used  . Alcohol Use: No     Comment: Former     Review of Systems  Constitutional: Negative for fever and unexpected weight change.  Respiratory: Negative for shortness of breath.   Gastrointestinal: Negative for nausea, vomiting and abdominal pain.  Musculoskeletal: Positive for back pain.  Psychiatric/Behavioral: Positive for behavioral problems.  All other systems reviewed and are negative.    Allergies  Banana  Home Medications   Prior to Admission medications   Medication Sig Start Date End Date Taking? Authorizing Provider  b complex vitamins tablet Take 1 tablet by mouth daily.    Historical Provider, MD  carbidopa-levodopa (SINEMET IR) 25-100 MG tablet Take 2 tablets by mouth 3 (three) times daily. 11/09/15   [name], MD  PARoxetine (PAXIL) 10 MG tablet Take 1 tablet (10 mg total) by mouth daily. 07/12/15   [name], MD  sildenafil (REVATIO) 20 MG tablet  09/08/15   Historical Provider, MD   BP 118/76 mmHg  Pulse 86  Temp(Src) 98.4 F (36.9 C) (Oral)  Resp 16  Ht 5\' 6"  (1.676  m)  Wt 125 lb (56.7 kg)  BMI 20.19 kg/m2  SpO2 100%   Physical Exam  Constitutional: He is oriented to person, place, and time.  Chronically ill-appearing, no acute distress, eyes closed throughout duration of history taking  HENT:  Head: Normocephalic and atraumatic.  Mouth/Throat: Oropharynx is clear and moist.  Eyes: EOM are normal. Pupils are equal, round, and reactive to light.  Cardiovascular: Normal rate, regular rhythm and normal heart sounds.   No murmur heard. Pulmonary/Chest: Effort normal and breath sounds normal. No respiratory distress. He has no wheezes.  Abdominal: Soft. Bowel sounds are normal. There is no tenderness. There is no  rebound.  Neurological: He is alert and oriented to person, place, and time.  Resting tremor, severe bend when standing upright, shuffling gait, strength intact  Skin: Skin is warm and dry.  Psychiatric:  Flat  Nursing note and vitals reviewed.   ED Course  Procedures (including critical care time) DIAGNOSTIC STUDIES: Oxygen Saturation is 100% on RA, normal by my interpretation.    COORDINATION OF CARE: 3:02 AM-Discussed treatment plan which includes CBC panel, CMP or UA with pt at bedside and pt agreed to plan.     Labs Review Labs Reviewed  COMPREHENSIVE METABOLIC PANEL - Abnormal; Notable for the following:    Glucose, Bld 125 (*)    All other components within normal limits  CBC - Abnormal; Notable for the following:    WBC 3.9 (*)    Hemoglobin 12.0 (*)    HCT 37.8 (*)    MCH 25.1 (*)    All other components within normal limits  URINALYSIS, ROUTINE W REFLEX MICROSCOPIC (NOT AT [facility]) - Abnormal; Notable for the following:    Ketones, ur 15 (*)    All other components within normal limits    Imaging Review No results found. I have personally reviewed and evaluated these lab results as part of my medical decision-making.   EKG Interpretation   Date/Time:  Wednesday February 08 2016 02:44:16 EDT Ventricular Rate:  79 PR Interval:    QRS Duration: 84 QT Interval:  391 QTC Calculation: 401 R Axis:   68 Text Interpretation:  Sinus rhythm Supraventricular bigeminy Left atrial  enlargement Probable left ventricular hypertrophy Confirmed by HORTON  MD,  Toni Amend ([id number]) on 02/08/2016 4:04:04 AM      MDM   Final diagnoses:  Depression    Patient presents with family from urgent care with concerns for documented abnormal vital signs. Was seen in urgent care because "he's not cooperating at home" and his wife feels that he may be dehydrated. I reviewed the notes from urgent care. There is no documented hypotension. BP documented at 107/65. Heart rate in the 90s. There  was concern given recent fall that patient may be more somnolent with a subacute injury. CT scan was ordered for this reason. Patient otherwise appears his baseline. He has 15 ketones in the urine likely reflective of mild dehydration. He otherwise appears to be his baseline. I discussed at length with the patient and his wife helping her with resources as she is his primary caregiver and recently had surgery herself. She states "we make too much money for home health." Basic labwork including CBC, BMP, urinalysis, and EKG obtained and are all reassuring. Patient is awake, alert, and oriented. He kept his eyes closed during history taking; however, was appropriate and not somnolent when engaged. Prior to obtaining CT, patient and wife requesting discharge without further evaluation. He has been medically  screened and appears to be at his baseline.  After history, exam, and medical workup I feel the patient has been appropriately medically screened and is safe for discharge home. Pertinent diagnoses were discussed with the patient. Patient was given return precautions.  I personally performed the services described in this documentation, which was scribed in my presence. The recorded information has been reviewed and is accurate.      Shon Batonourtney F Horton, MD 02/08/16 [phone]

## 2016-02-11 ENCOUNTER — Emergency Department (HOSPITAL_COMMUNITY): Payer: Medicare Other

## 2016-02-11 ENCOUNTER — Encounter (HOSPITAL_COMMUNITY): Payer: Self-pay

## 2016-02-11 ENCOUNTER — Emergency Department (HOSPITAL_COMMUNITY)
Admission: EM | Admit: 2016-02-11 | Discharge: 2016-02-11 | Disposition: A | Discharge status: Home / Self Care | Payer: Medicare Other | Attending: Emergency Medicine | Admitting: Emergency Medicine

## 2016-02-11 DIAGNOSIS — G20 Parkinson's disease: Secondary | ICD-10-CM | POA: Insufficient documentation

## 2016-02-11 DIAGNOSIS — I10 Essential (primary) hypertension: Secondary | ICD-10-CM

## 2016-02-11 DIAGNOSIS — Z79899 Other long term (current) drug therapy: Secondary | ICD-10-CM | POA: Diagnosis not present

## 2016-02-11 DIAGNOSIS — R404 Transient alteration of awareness: Secondary | ICD-10-CM | POA: Diagnosis not present

## 2016-02-11 DIAGNOSIS — R531 Weakness: Secondary | ICD-10-CM | POA: Diagnosis not present

## 2016-02-11 DIAGNOSIS — Z87891 Personal history of nicotine dependence: Secondary | ICD-10-CM | POA: Insufficient documentation

## 2016-02-11 DIAGNOSIS — G20A1 Parkinson's disease without dyskinesia, without mention of fluctuations: Secondary | ICD-10-CM

## 2016-02-11 LAB — RAPID URINE DRUG SCREEN, HOSP PERFORMED
AMPHETAMINES: NOT DETECTED
BENZODIAZEPINES: NOT DETECTED
Barbiturates: NOT DETECTED
Cocaine: NOT DETECTED
OPIATES: NOT DETECTED
Tetrahydrocannabinol: NOT DETECTED

## 2016-02-11 LAB — I-STAT CHEM 8, ED
BUN: 19 mg/dL (ref 6–20)
CALCIUM ION: 1.23 mmol/L (ref 1.12–1.23)
CHLORIDE: 101 mmol/L (ref 101–111)
Creatinine, Ser: 0.6 mg/dL — ABNORMAL LOW (ref 0.61–1.24)
GLUCOSE: 95 mg/dL (ref 65–99)
HCT: 39 % (ref 39.0–52.0)
Hemoglobin: 13.3 g/dL (ref 13.0–17.0)
Potassium: 4.4 mmol/L (ref 3.5–5.1)
Sodium: 141 mmol/L (ref 135–145)
TCO2: 29 mmol/L (ref 0–100)

## 2016-02-11 LAB — URINALYSIS, ROUTINE W REFLEX MICROSCOPIC
BILIRUBIN URINE: NEGATIVE
Glucose, UA: NEGATIVE mg/dL
HGB URINE DIPSTICK: NEGATIVE
Ketones, ur: NEGATIVE mg/dL
Leukocytes, UA: NEGATIVE
Nitrite: NEGATIVE
PH: 7.5 (ref 5.0–8.0)
Protein, ur: NEGATIVE mg/dL
SPECIFIC GRAVITY, URINE: 1.016 (ref 1.005–1.030)

## 2016-02-11 MED ORDER — CLONIDINE HCL 0.1 MG PO TABS
0.2000 mg | ORAL_TABLET | Freq: Once | ORAL | Status: AC
  Administered 2016-02-11: 0.2 mg via ORAL
  Filled 2016-02-11: qty 2

## 2016-02-11 MED ORDER — HYDROCHLOROTHIAZIDE 25 MG PO TABS: 25.0000 mg | ORAL_TABLET | Freq: Every day | ORAL | Status: DC

## 2016-02-11 NOTE — Discharge Instructions (Signed)
DASH Eating Plan °DASH stands for "Dietary Approaches to Stop Hypertension." The DASH eating plan is a healthy eating plan that has been shown to reduce high blood pressure (hypertension). Additional health benefits may include reducing the risk of type 2 diabetes mellitus, heart disease, and stroke. The DASH eating plan may also help with weight loss. °WHAT DO I NEED TO KNOW ABOUT THE DASH EATING PLAN? °For the DASH eating plan, you will follow these general guidelines: °· Choose foods with a percent daily value for sodium of less than 5% (as listed on the food label). °· Use salt-free seasonings or herbs instead of table salt or sea salt. °· Check with your health care provider or pharmacist before using salt substitutes. °· Eat lower-sodium products, often labeled as "lower sodium" or "no salt added." °· Eat fresh foods. °· Eat more vegetables, fruits, and low-fat dairy products. °· Choose whole grains. Look for the word "whole" as the first word in the ingredient list. °· Choose fish and skinless chicken or turkey more often than red meat. Limit fish, poultry, and meat to 6 oz (170 g) each day. °· Limit sweets, desserts, sugars, and sugary drinks. °· Choose heart-healthy fats. °· Limit cheese to 1 oz (28 g) per day. °· Eat more home-cooked food and less restaurant, buffet, and fast food. °· Limit fried foods. °· Cook foods using methods other than frying. °· Limit canned vegetables. If you do use them, rinse them well to decrease the sodium. °· When eating at a restaurant, ask that your food be prepared with less salt, or no salt if possible. °WHAT FOODS CAN I EAT? °Seek help from a dietitian for individual calorie needs. °Grains °Whole grain or whole wheat bread. Brown rice. Whole grain or whole wheat pasta. Quinoa, bulgur, and whole grain cereals. Low-sodium cereals. Corn or whole wheat flour tortillas. Whole grain cornbread. Whole grain crackers. Low-sodium crackers. °Vegetables °Fresh or frozen vegetables  (raw, steamed, roasted, or grilled). Low-sodium or reduced-sodium tomato and vegetable juices. Low-sodium or reduced-sodium tomato sauce and paste. Low-sodium or reduced-sodium canned vegetables.  °Fruits °All fresh, canned (in natural juice), or frozen fruits. °Meat and Other Protein Products °Ground beef (85% or leaner), grass-fed beef, or beef trimmed of fat. Skinless chicken or turkey. Ground chicken or turkey. Pork trimmed of fat. All fish and seafood. Eggs. Dried beans, peas, or lentils. Unsalted nuts and seeds. Unsalted canned beans. °Dairy °Low-fat dairy products, such as skim or 1% milk, 2% or reduced-fat cheeses, low-fat ricotta or cottage cheese, or plain low-fat yogurt. Low-sodium or reduced-sodium cheeses. °Fats and Oils °Tub margarines without trans fats. Light or reduced-fat mayonnaise and salad dressings (reduced sodium). Avocado. Safflower, olive, or canola oils. Natural peanut or almond butter. °Other °Unsalted popcorn and pretzels. °The items listed above may not be a complete list of recommended foods or beverages. Contact your dietitian for more options. °WHAT FOODS ARE NOT RECOMMENDED? °Grains °White bread. White pasta. White rice. Refined cornbread. Bagels and croissants. Crackers that contain trans fat. °Vegetables °Creamed or fried vegetables. Vegetables in a cheese sauce. Regular canned vegetables. Regular canned tomato sauce and paste. Regular tomato and vegetable juices. °Fruits °Dried fruits. Canned fruit in light or heavy syrup. Fruit juice. °Meat and Other Protein Products °Fatty cuts of meat. Ribs, chicken wings, bacon, sausage, bologna, salami, chitterlings, fatback, hot dogs, bratwurst, and packaged luncheon meats. Salted nuts and seeds. Canned beans with salt. °Dairy °Whole or 2% milk, cream, half-and-half, and cream cheese. Whole-fat or sweetened yogurt. Full-fat   cheeses or blue cheese. Nondairy creamers and whipped toppings. Processed cheese, cheese spreads, or cheese  curds. °Condiments °Onion and garlic salt, seasoned salt, table salt, and sea salt. Canned and packaged gravies. Worcestershire sauce. Tartar sauce. Barbecue sauce. Teriyaki sauce. Soy sauce, including reduced sodium. Steak sauce. Fish sauce. Oyster sauce. Cocktail sauce. Horseradish. Ketchup and mustard. Meat flavorings and tenderizers. Bouillon cubes. Hot sauce. Tabasco sauce. Marinades. Taco seasonings. Relishes. °Fats and Oils °Butter, stick margarine, lard, shortening, ghee, and bacon fat. Coconut, palm kernel, or palm oils. Regular salad dressings. °Other °Pickles and olives. Salted popcorn and pretzels. °The items listed above may not be a complete list of foods and beverages to avoid. Contact your dietitian for more information. °WHERE CAN I FIND MORE INFORMATION? °National Heart, Lung, and Blood Institute: www.nhlbi.nih.gov/health/health-topics/topics/dash/ °  °This information is not intended to replace advice given to you by your health care provider. Make sure you discuss any questions you have with your health care provider. °  °Document Released: 07/05/2011 Document Revised: 08/06/2014 Document Reviewed: 05/20/2013 °Elsevier Interactive Patient Education ©2016 Elsevier Inc. ° °Hypertension °Hypertension is another name for high blood pressure. High blood pressure forces your heart to work harder to pump blood. A blood pressure reading has two numbers, which includes a higher number over a lower number (example: 110/72). °HOME CARE  °· Have your blood pressure rechecked by your doctor. °· Only take medicine as told by your doctor. Follow the directions carefully. The medicine does not work as well if you skip doses. Skipping doses also puts you at risk for problems. °· Do not smoke. °· Monitor your blood pressure at home as told by your doctor. °GET HELP IF: °· You think you are having a reaction to the medicine you are taking. °· You have repeat headaches or feel dizzy. °· You have puffiness (swelling)  in your ankles. °· You have trouble with your vision. °GET HELP RIGHT AWAY IF:  °· You get a very bad headache and are confused. °· You feel weak, numb, or faint. °· You get chest or belly (abdominal) pain. °· You throw up (vomit). °· You cannot breathe very well. °MAKE SURE YOU:  °· Understand these instructions. °· Will watch your condition. °· Will get help right away if you are not doing well or get worse. °  °This information is not intended to replace advice given to you by your health care provider. Make sure you discuss any questions you have with your health care provider. °  °Document Released: 01/02/2008 Document Revised: 07/21/2013 Document Reviewed: 05/08/2013 °Elsevier Interactive Patient Education ©2016 Elsevier Inc. ° °

## 2016-02-11 NOTE — ED Notes (Signed)
Pt cannot use restroom at this time, aware urine specimen is needed.  

## 2016-02-11 NOTE — ED Notes (Signed)
PT RECEIVED FROM HOME VIA EMS C/O FATIGUE X3 DAYS. PT HAS A HX OF PARKINSON'S DISEASE. UPON EMS' ARRIVAL, THE PT'S BP WAS 190/110, WITH NO PREVIOUS HISTORY. PT DENIES HEADACHE, DIZZINESS, OR VISUAL CHANGES. PT ALSO C/O CHRONIC LEFT LOWER BACK PAIN. DENIES INJURY.

## 2016-02-11 NOTE — ED Provider Notes (Signed)
CSN: [id number]     Arrival date & time 02/11/16  1631 History   First MD Initiated Contact with Patient 02/11/16 1702     Chief Complaint  Patient presents with  . Fatigue    X3 DAYS  . Hypertension    BP 190/110 NO HX      HPI Patient presents with not feeling well and fatigue for the last 3 days.  Has past medical history of Parkinson's disease.  No history of hypertension.  Denies headache.  Did have a fall a few weeks ago.  Has a gait abnormality secondary to the Parkinson's. Past Medical History  Diagnosis Date  . Parkinson disease (HCC)   . Focal dystonia   . Camptocormia   . Abnormality of gait 07/11/2015  . Memory disorder 11/09/2015   Past Surgical History  Procedure Laterality Date  . Ankle surgery Right   . Cataract extraction     Family History  Problem Relation Age of Onset  . Heart failure Mother   . Cirrhosis Father   . Alcohol abuse Father   . Tremor Sister   . COPD Brother   . Diabetes Brother   . Heart failure Brother    Social History  Substance Use Topics  . Smoking status: Former Games developer  . Smokeless tobacco: Never Used  . Alcohol Use: No     Comment: Former     Review of Systems  All other systems reviewed and are negative.     Allergies  Banana  Home Medications   Prior to Admission medications   Medication Sig Start Date End Date Taking? Authorizing Provider  b complex vitamins tablet Take 1 tablet by mouth daily.    Historical Provider, MD  carbidopa-levodopa (SINEMET IR) 25-100 MG tablet Take 2 tablets by mouth 3 (three) times daily. 11/09/15   [name], MD  hydrochlorothiazide (HYDRODIURIL) 25 MG tablet Take 1 tablet (25 mg total) by mouth daily. 02/11/16   Nelva Nay, MD  PARoxetine (PAXIL) 10 MG tablet Take 1 tablet (10 mg total) by mouth daily. 07/12/15   [name], MD  sildenafil (REVATIO) 20 MG tablet  09/08/15   Historical Provider, MD   BP 120/74 mmHg  Pulse 66  Temp(Src) 98.1 F (36.7 C) (Oral)  Resp  12  Ht  (1.651 m)  Wt 125 lb (56.7 kg)  BMI 20.80 kg/m2  SpO2 100% Physical Exam  Constitutional: He is oriented to person, place, and time. He appears well-developed and well-nourished. No distress.  HENT:  Head: Normocephalic and atraumatic.  Eyes: Pupils are equal, round, and reactive to light.  Neck: Normal range of motion.  Cardiovascular: Normal rate and intact distal pulses.   Pulmonary/Chest: No respiratory distress.  Abdominal: Normal appearance. He exhibits no distension.  Musculoskeletal: Normal range of motion.  Neurological: He is alert and oriented to person, place, and time. He displays tremor. No cranial nerve deficit.  Skin: Skin is warm and dry. No rash noted.  Psychiatric: He has a normal mood and affect. His behavior is normal.  Nursing note and vitals reviewed.   ED Course  Procedures (including critical care time) Medications  cloNIDine (CATAPRES) tablet 0.2 mg (0.2 mg Oral Given 02/11/16 1716)    Labs Review Labs Reviewed  I-STAT CHEM 8, ED - Abnormal; Notable for the following:    Creatinine, Ser 0.60 (*)    All other components within normal limits  URINALYSIS, ROUTINE W REFLEX MICROSCOPIC (NOT AT [facility])  URINE RAPID  DRUG SCREEN, HOSP PERFORMED    Imaging Review Dg Chest 2 View  02/11/2016  CLINICAL DATA:  Pt c/o weakness C/O FATIGUE X3 DAYS. PT HAS A HX OF PARKINSON'S DISEASE. UPON EMS' ARRIVAL, THE PT'S BP WAS 190/110, WITH NO PREVIOUS HISTORY. PT DENIES HEADACHE, DIZZINESS, OR VISUAL CHANGES. PT ALSO C/O CHRONIC LEFT LOWER BACK PAIN. DENIES INJURY. EXAM: CHEST  2 VIEW COMPARISON:  None. FINDINGS: The heart size and mediastinal contours are within normal limits. Both lungs are clear. The visualized skeletal structures are unremarkable. IMPRESSION: No active cardiopulmonary disease. Electronically Signed   By: Norva PavlovElizabeth  Brown M.D.   On: 02/11/2016 17:40   Ct Head Wo Contrast  02/11/2016  CLINICAL DATA:  70 year old male with fatigue for 3 days.  Weakness. Parkinson's disease. Initial encounter. EXAM: CT HEAD WITHOUT CONTRAST TECHNIQUE: Contiguous axial images were obtained from the base of the skull through the vertex without intravenous contrast. COMPARISON:  None. FINDINGS: Visualized paranasal sinuses and mastoids are well pneumatized. No acute osseous abnormality identified. No acute orbit or scalp soft tissue findings. Calcified atherosclerosis at the skull base. Cerebral volume is within normal limits for age. No ventriculomegaly. No midline shift, mass effect, or evidence of intracranial mass lesion. No acute intracranial hemorrhage identified. No cortically based acute infarct identified. Patchy and confluent bilateral cerebral white matter hypodensity. No suspicious intracranial vascular hyperdensity. IMPRESSION: 1.  No acute intracranial abnormality. 2. Mild to moderate for age nonspecific white matter changes, most commonly due to chronic small vessel disease. Electronically Signed   By: Odessa FlemingH  Hall M.D.   On: 02/11/2016 18:27   I have personally reviewed and evaluated these images and lab results as part of my medical decision-making.    MDM   Final diagnoses:  Essential hypertension  Parkinson disease (HCC)        Nelva Nayobert Evelen Vazguez, MD 02/12/16 [phone]

## 2016-02-11 NOTE — ED Notes (Signed)
Bed: WA01 Expected date:  Expected time:  Means of arrival:  Comments: EMS 

## 2016-02-11 NOTE — ED Notes (Signed)
PT DISCHARGED. INSTRUCTIONS AND PRESCRIPTION GIVEN. AAOX4. PT IN NO APPARENT DISTRESS OR PAIN. THE OPPORTUNITY TO ASK QUESTIONS WAS PROVIDED. 

## 2016-02-13 ENCOUNTER — Ambulatory Visit: Payer: Federal, State, Local not specified - PPO | Admitting: Physical Therapy

## 2016-02-13 ENCOUNTER — Encounter: Payer: Federal, State, Local not specified - PPO | Admitting: Occupational Therapy

## 2016-02-15 ENCOUNTER — Ambulatory Visit: Payer: Federal, State, Local not specified - PPO | Admitting: Physical Therapy

## 2016-02-15 ENCOUNTER — Encounter: Payer: Federal, State, Local not specified - PPO | Admitting: Occupational Therapy

## 2016-02-15 ENCOUNTER — Ambulatory Visit: Payer: Medicare Other | Admitting: Neurology

## 2016-03-01 DIAGNOSIS — R739 Hyperglycemia, unspecified: Secondary | ICD-10-CM | POA: Diagnosis not present

## 2016-03-01 DIAGNOSIS — B351 Tinea unguium: Secondary | ICD-10-CM | POA: Diagnosis not present

## 2016-03-01 DIAGNOSIS — D649 Anemia, unspecified: Secondary | ICD-10-CM | POA: Diagnosis not present

## 2016-03-01 DIAGNOSIS — R634 Abnormal weight loss: Secondary | ICD-10-CM | POA: Diagnosis not present

## 2016-03-01 DIAGNOSIS — I10 Essential (primary) hypertension: Secondary | ICD-10-CM | POA: Diagnosis not present

## 2016-03-01 DIAGNOSIS — G20 Parkinson's disease: Secondary | ICD-10-CM | POA: Diagnosis not present

## 2016-03-15 ENCOUNTER — Ambulatory Visit (INDEPENDENT_AMBULATORY_CARE_PROVIDER_SITE_OTHER): Payer: Medicare Other | Admitting: Neurology

## 2016-03-15 ENCOUNTER — Encounter: Payer: Self-pay | Admitting: Neurology

## 2016-03-15 ENCOUNTER — Telehealth: Payer: Self-pay

## 2016-03-15 VITALS — BP 136/86 | HR 96 | Ht 65.0 in | Wt 121.5 lb

## 2016-03-15 DIAGNOSIS — R413 Other amnesia: Secondary | ICD-10-CM | POA: Diagnosis not present

## 2016-03-15 DIAGNOSIS — R269 Unspecified abnormalities of gait and mobility: Secondary | ICD-10-CM

## 2016-03-15 DIAGNOSIS — G20 Parkinson's disease: Secondary | ICD-10-CM

## 2016-03-15 MED ORDER — CARBIDOPA-LEVODOPA ER 50-200 MG PO TBCR: 1.0000 | EXTENDED_RELEASE_TABLET | Freq: Three times a day (TID) | ORAL | 3 refills | Status: AC

## 2016-03-15 NOTE — Telephone Encounter (Signed)
Pt arrived to appt, thought that his appt was an hr later than scheduled.

## 2016-03-15 NOTE — Patient Instructions (Addendum)
   We will change the Sinemet (carbidopa) tablet from the 25/100 mg tablet to the 50/200 mg CR tablet, take one three times a day.  Parkinson Disease Parkinson disease is a disorder of the central nervous system, which includes the brain and spinal cord. A person with this disease slowly loses the ability to completely control body movements. Within the brain, there is a group of nerve cells (basal ganglia) that help control movement. The basal ganglia are damaged and do not work properly in a person with Parkinson disease. In addition, the basal ganglia produce and use a brain chemical called dopamine. The dopamine chemical sends messages to other parts of the body to control and coordinate body movements. Dopamine levels are low in a person with Parkinson disease. If the dopamine levels are low, then the body does not receive the correct messages it needs to move normally.  CAUSES  The exact reason why the basal ganglia get damaged is not known. Some medical researchers have thought that infection, genes, environment, and certain medicines may contribute to the cause.  SYMPTOMS   An early symptom of Parkinson disease is often an uncontrolled shaking (tremor) of the hands. The tremor will often disappear when the affected hand is consciously used.  As the disease progresses, walking, talking, getting out of a chair, and new movements become more difficult.  Muscles get stiff and movements become slower.  Balance and coordination become harder.  Depression, trouble swallowing, urinary problems, constipation, and sleep problems can occur.  Later in the disease, memory and thought processes may deteriorate. DIAGNOSIS  There are no specific tests to diagnose Parkinson disease. You may be referred to a neurologist for evaluation. Your caregiver will ask about your medical history, symptoms, and perform a physical exam. Blood tests and imaging tests of your brain may be performed to rule out other  diseases. The imaging tests may include an MRI or a CT scan. TREATMENT  The goal of treatment is to relieve symptoms. Medicines may be prescribed once the symptoms become troublesome. Medicine will not stop the progression of the disease, but medicine can make movement and balance better and help control tremors. Speech and occupational therapy may also be prescribed. Sometimes, surgical treatment of the brain can be done in young people. HOME CARE INSTRUCTIONS  Get regular exercise and rest periods during the day to help prevent exhaustion and depression.  If getting dressed becomes difficult, replace buttons and zippers with Velcro and elastic on your clothing.  Take all medicine as directed by your caregiver.  Install grab bars or railings in your home to prevent falls.  Go to speech or occupational therapy as directed.  Keep all follow-up visits as directed by your caregiver. SEEK MEDICAL CARE IF:  Your symptoms are not controlled with your medicine.  You fall.  You have trouble swallowing or choke on your food. MAKE SURE YOU:  Understand these instructions.  Will watch your condition.  Will get help right away if you are not doing well or get worse.   This information is not intended to replace advice given to you by your health care provider. Make sure you discuss any questions you have with your health care provider.   Document Released: 07/13/2000 Document Revised: 11/10/2012 Document Reviewed: 08/15/2011 Elsevier Interactive Patient Education [employer]2016 [employer] Inc.

## 2016-03-15 NOTE — Telephone Encounter (Signed)
Pt no-showed today's follow-up appt. 

## 2016-03-15 NOTE — Progress Notes (Signed)
Reason for visit: Parkinson's disease  Bradley Fleming is an 70 y.o. male  History of present illness:  Bradley Fleming is a 70 year old right-handed black male with a history of Parkinson's disease. The patient has had 2 recent emergency room visits within the last month for elevated blood pressure problems, he has been placed on hydrochlorothiazide. He reports one fall since last seen while he was at a restaurant, he did bump his head but did not sustain significant injury. The patient has recently not been very active. He has had trouble tolerating the 25/100 mg Sinemet tablets secondary to significant drowsiness. He will spend much of the day sleeping. The patient at times may have vivid dreams at night, some confusion coming out of sleep. The patient denies problems with swallowing but he reports that when trying to swallow capsules he has difficulty with choking. He does have an underlying memory issue. He returns to this office for an evaluation.  Past Medical History:  Diagnosis Date  . Abnormality of gait 07/11/2015  . Camptocormia   . Focal dystonia   . Memory disorder 11/09/2015  . Parkinson disease [facility](HCC)     Past Surgical History:  Procedure Laterality Date  . ANKLE SURGERY Right   . CATARACT EXTRACTION      Family History  Problem Relation Age of Onset  . Heart failure Mother   . Cirrhosis Father   . Alcohol abuse Father   . Tremor Sister   . COPD Brother   . Diabetes Brother   . Heart failure Brother     Social history:  reports that he has quit smoking. He has never used smokeless tobacco. He reports that he does not drink alcohol or use drugs.    Allergies  Allergen Reactions  . Banana     Medications:  Prior to Admission medications   Medication Sig Start Date End Date Taking? Authorizing Provider  gabapentin (NEURONTIN) 300 MG capsule  11/23/15  Yes Historical Provider, MD  PARoxetine (PAXIL) 20 MG tablet  12/03/15  Yes Historical Provider, MD  b complex  vitamins tablet Take 1 tablet by mouth daily.    Historical Provider, MD  carbidopa-levodopa (SINEMET IR) 25-100 MG tablet Take 2 tablets by mouth 3 (three) times daily. 11/09/15   York Spanielharles K Yaman Grauberger, MD  hydrochlorothiazide (HYDRODIURIL) 25 MG tablet Take 1 tablet (25 mg total) by mouth daily. 02/11/16   Nelva Nayobert Beaton, MD  sildenafil (REVATIO) 20 MG tablet  09/08/15   Historical Provider, MD    ROS:  Out of a complete 14 system review of symptoms, the patient complains only of the following symptoms, and all other reviewed systems are negative.  Fatigue Cold intolerance Urinary urgency Low back pain, achy muscles, walking difficulty Memory loss, tremors Depression  Blood pressure 136/86, pulse 96, height 5\' 5"  (1.651 m), weight 121 lb 8 oz (55.1 kg).  Physical Exam  General: The patient is alert and cooperative at the time of the examination.  Skin: No significant peripheral edema is noted.   Neurologic Exam  Mental status: The patient is alert and oriented x 3 at the time of the examination. The patient has apparent normal recent and remote memory, with an apparently normal attention span and concentration ability.   Cranial nerves: Facial symmetry is present. Speech is normal, no aphasia or dysarthria is noted. Extraocular movements are full. Visual fields are full. Masking of the face is seen.  Motor: The patient has good strength in all 4 extremities.  Sensory examination: Soft touch sensation is symmetric on the face, arms, and legs.  Coordination: The patient has good finger-nose-finger and heel-to-shin bilaterally.  Gait and station: The patient is quite stooped with standing, he walks with a walker. He has relatively good stability with walking. Romberg is negative, tandem gait was not attempted.  Reflexes: Deep tendon reflexes are symmetric.   Assessment/Plan:  1. Parkinson's disease  2. Mild memory disorder  3. Gait disorder  4. Camptocormia  The patient is  having difficulty tolerating the short acting Sinemet, we will switch to the 50/200 mg CR tablets taking 1 tablet 3 times daily. If he continues to have issues with drowsiness, the family will call me. I have stressed that he must remain active to help slow down progression of the Parkinson's disease. We will continue to follow the memory issues, he may require a medication for memory in the future. The patient will follow-up in 5 months.  Marlan Palau. Keith Arlando Leisinger MD 03/15/2016 11:27 AM  [facility] [address] [place] [id number]  Phone [phone] Fax [phone]

## 2016-03-21 DIAGNOSIS — K5900 Constipation, unspecified: Secondary | ICD-10-CM | POA: Diagnosis not present

## 2016-03-27 ENCOUNTER — Telehealth: Payer: Self-pay | Admitting: Neurology

## 2016-03-27 MED ORDER — GABAPENTIN 300 MG PO CAPS: 300.0000 mg | ORAL_CAPSULE | Freq: Three times a day (TID) | ORAL | 3 refills | Status: AC

## 2016-03-27 NOTE — Telephone Encounter (Signed)
Patient's wife is calling. He is taking gabapentin (NEURONTIN) 300 MG capsule and thinks the dosage needs to be increased. It is not helping because his feet are still burning. Can a higher dosage be prescribed. Please call to discuss. The patient uses [facility] on [address].

## 2016-03-27 NOTE — Telephone Encounter (Signed)
Spoke to patient's wife - patient is currently taking gabapentin 300mg  BID but it is not helpful for his symptoms.  They would like to discuss an increase in the dosage of gabapentin.

## 2016-03-27 NOTE — Addendum Note (Signed)
Addended by: Stephanie AcreWILLIS, Hildegarde Dunaway on: 03/27/2016 05:01 PM   Modules accepted: Orders

## 2016-03-27 NOTE — Telephone Encounter (Signed)
I called the patient, talk with the wife. The patient has burning in the feet, he has been given a prescription for gabapentin through another doctor, taking 300 mg twice daily. The wife is not sure what other doctor wrote the prescription.  I will go up on the gabapentin taking 300 mg 3 times daily.

## 2016-03-30 DIAGNOSIS — F411 Generalized anxiety disorder: Secondary | ICD-10-CM | POA: Diagnosis not present

## 2016-03-30 DIAGNOSIS — F331 Major depressive disorder, recurrent, moderate: Secondary | ICD-10-CM | POA: Diagnosis not present

## 2016-05-11 DIAGNOSIS — F411 Generalized anxiety disorder: Secondary | ICD-10-CM | POA: Diagnosis not present

## 2016-05-11 DIAGNOSIS — F331 Major depressive disorder, recurrent, moderate: Secondary | ICD-10-CM | POA: Diagnosis not present

## 2016-05-17 ENCOUNTER — Ambulatory Visit: Payer: Medicare Other | Admitting: Neurology

## 2016-05-21 DIAGNOSIS — T8522XA Displacement of intraocular lens, initial encounter: Secondary | ICD-10-CM | POA: Diagnosis not present

## 2016-05-21 DIAGNOSIS — H47329 Drusen of optic disc, unspecified eye: Secondary | ICD-10-CM | POA: Diagnosis not present

## 2016-05-21 DIAGNOSIS — H40023 Open angle with borderline findings, high risk, bilateral: Secondary | ICD-10-CM | POA: Diagnosis not present

## 2016-06-06 ENCOUNTER — Telehealth: Payer: Self-pay | Admitting: Neurology

## 2016-06-06 NOTE — Telephone Encounter (Signed)
Wife Elease Hashimotoatricia called to check status of form to participate in program [facility] that was faxed last week. Please call to advise.

## 2016-06-06 NOTE — Telephone Encounter (Signed)
Returned call to notify pt's wife that Parkinson program  forms were received and [facility] last Thursday 05/31/16. May call back w/ additional questions/concerns.

## 2016-06-07 DIAGNOSIS — F444 Conversion disorder with motor symptom or deficit: Secondary | ICD-10-CM | POA: Diagnosis not present

## 2016-06-07 DIAGNOSIS — R5382 Chronic fatigue, unspecified: Secondary | ICD-10-CM | POA: Diagnosis not present

## 2016-06-07 DIAGNOSIS — G20 Parkinson's disease: Secondary | ICD-10-CM | POA: Diagnosis not present

## 2016-06-28 DIAGNOSIS — F411 Generalized anxiety disorder: Secondary | ICD-10-CM | POA: Diagnosis not present

## 2016-06-28 DIAGNOSIS — F331 Major depressive disorder, recurrent, moderate: Secondary | ICD-10-CM | POA: Diagnosis not present

## 2016-07-02 DIAGNOSIS — K5900 Constipation, unspecified: Secondary | ICD-10-CM | POA: Diagnosis not present

## 2016-07-03 DIAGNOSIS — D649 Anemia, unspecified: Secondary | ICD-10-CM | POA: Diagnosis not present

## 2016-07-03 DIAGNOSIS — G20 Parkinson's disease: Secondary | ICD-10-CM | POA: Diagnosis not present

## 2016-07-03 DIAGNOSIS — B351 Tinea unguium: Secondary | ICD-10-CM | POA: Diagnosis not present

## 2016-07-03 DIAGNOSIS — R739 Hyperglycemia, unspecified: Secondary | ICD-10-CM | POA: Diagnosis not present

## 2016-07-03 DIAGNOSIS — I10 Essential (primary) hypertension: Secondary | ICD-10-CM | POA: Diagnosis not present

## 2016-07-31 DIAGNOSIS — R32 Unspecified urinary incontinence: Secondary | ICD-10-CM | POA: Diagnosis not present

## 2016-08-15 ENCOUNTER — Ambulatory Visit: Payer: Medicare Other | Admitting: Neurology

## 2016-08-17 ENCOUNTER — Emergency Department (HOSPITAL_COMMUNITY)
Admission: EM | Admit: 2016-08-17 | Discharge: 2016-08-17 | Disposition: A | Discharge status: Home / Self Care | Payer: Medicare Other | Attending: Emergency Medicine | Admitting: Emergency Medicine

## 2016-08-17 ENCOUNTER — Emergency Department (HOSPITAL_COMMUNITY): Payer: Medicare Other

## 2016-08-17 ENCOUNTER — Encounter (HOSPITAL_COMMUNITY): Payer: Self-pay | Admitting: Nurse Practitioner

## 2016-08-17 DIAGNOSIS — Y939 Activity, unspecified: Secondary | ICD-10-CM | POA: Diagnosis not present

## 2016-08-17 DIAGNOSIS — S098XXA Other specified injuries of head, initial encounter: Secondary | ICD-10-CM | POA: Diagnosis not present

## 2016-08-17 DIAGNOSIS — Z87891 Personal history of nicotine dependence: Secondary | ICD-10-CM | POA: Diagnosis not present

## 2016-08-17 DIAGNOSIS — Y999 Unspecified external cause status: Secondary | ICD-10-CM | POA: Insufficient documentation

## 2016-08-17 DIAGNOSIS — S0181XA Laceration without foreign body of other part of head, initial encounter: Secondary | ICD-10-CM | POA: Insufficient documentation

## 2016-08-17 DIAGNOSIS — Z79899 Other long term (current) drug therapy: Secondary | ICD-10-CM | POA: Diagnosis not present

## 2016-08-17 DIAGNOSIS — G20 Parkinson's disease: Secondary | ICD-10-CM

## 2016-08-17 DIAGNOSIS — W01198A Fall on same level from slipping, tripping and stumbling with subsequent striking against other object, initial encounter: Secondary | ICD-10-CM | POA: Diagnosis not present

## 2016-08-17 DIAGNOSIS — S0993XA Unspecified injury of face, initial encounter: Secondary | ICD-10-CM | POA: Diagnosis not present

## 2016-08-17 DIAGNOSIS — Y92002 Bathroom of unspecified non-institutional (private) residence single-family (private) house as the place of occurrence of the external cause: Secondary | ICD-10-CM | POA: Diagnosis not present

## 2016-08-17 DIAGNOSIS — M545 Low back pain: Secondary | ICD-10-CM | POA: Diagnosis not present

## 2016-08-17 DIAGNOSIS — S3992XA Unspecified injury of lower back, initial encounter: Secondary | ICD-10-CM | POA: Diagnosis not present

## 2016-08-17 DIAGNOSIS — W010XXA Fall on same level from slipping, tripping and stumbling without subsequent striking against object, initial encounter: Secondary | ICD-10-CM

## 2016-08-17 DIAGNOSIS — S0121XA Laceration without foreign body of nose, initial encounter: Secondary | ICD-10-CM | POA: Insufficient documentation

## 2016-08-17 DIAGNOSIS — S0990XA Unspecified injury of head, initial encounter: Secondary | ICD-10-CM | POA: Diagnosis not present

## 2016-08-17 DIAGNOSIS — S199XXA Unspecified injury of neck, initial encounter: Secondary | ICD-10-CM | POA: Diagnosis not present

## 2016-08-17 LAB — URINALYSIS, ROUTINE W REFLEX MICROSCOPIC
Bilirubin Urine: NEGATIVE
Glucose, UA: NEGATIVE mg/dL
Ketones, ur: 5 mg/dL — AB
LEUKOCYTES UA: NEGATIVE
Nitrite: NEGATIVE
Protein, ur: NEGATIVE mg/dL
SPECIFIC GRAVITY, URINE: 1.016 (ref 1.005–1.030)
Squamous Epithelial / LPF: NONE SEEN
pH: 7 (ref 5.0–8.0)

## 2016-08-17 LAB — COMPREHENSIVE METABOLIC PANEL
ALBUMIN: 4.1 g/dL (ref 3.5–5.0)
ALK PHOS: 79 U/L (ref 38–126)
ALT: 10 U/L — AB (ref 17–63)
AST: 36 U/L (ref 15–41)
Anion gap: 8 (ref 5–15)
BUN: 23 mg/dL — AB (ref 6–20)
CALCIUM: 9.8 mg/dL (ref 8.9–10.3)
CO2: 31 mmol/L (ref 22–32)
CREATININE: 0.81 mg/dL (ref 0.61–1.24)
Chloride: 98 mmol/L — ABNORMAL LOW (ref 101–111)
GFR calc non Af Amer: >60 mL/min (ref >60)
Glucose, Bld: 100 mg/dL — ABNORMAL HIGH (ref 65–99)
Potassium: 5.1 mmol/L (ref 3.5–5.1)
SODIUM: 137 mmol/L (ref 135–145)
Total Bilirubin: 0.4 mg/dL (ref 0.3–1.2)
Total Protein: 7.4 g/dL (ref 6.5–8.1)

## 2016-08-17 LAB — CBC WITH DIFFERENTIAL/PLATELET
BASOS PCT: 0 %
Basophils Absolute: 0 10*3/uL (ref 0.0–0.1)
EOS PCT: 1 %
Eosinophils Absolute: 0.1 10*3/uL (ref 0.0–0.7)
HCT: 37.4 % — ABNORMAL LOW (ref 39.0–52.0)
Hemoglobin: 12.5 g/dL — ABNORMAL LOW (ref 13.0–17.0)
LYMPHS ABS: 1.3 10*3/uL (ref 0.7–4.0)
Lymphocytes Relative: 27 %
MCH: 25.5 pg — AB (ref 26.0–34.0)
MCHC: 33.4 g/dL (ref 30.0–36.0)
MCV: 76.3 fL — ABNORMAL LOW (ref 78.0–100.0)
MONO ABS: 0.5 10*3/uL (ref 0.1–1.0)
MONOS PCT: 10 %
Neutro Abs: 3.1 10*3/uL (ref 1.7–7.7)
Neutrophils Relative %: 62 %
Platelets: 211 10*3/uL (ref 150–400)
RBC: 4.9 MIL/uL (ref 4.22–5.81)
RDW: 14.2 % (ref 11.5–15.5)
WBC: 5 10*3/uL (ref 4.0–10.5)

## 2016-08-17 MED ORDER — TETANUS-DIPHTH-ACELL PERTUSSIS 5-2.5-18.5 LF-MCG/0.5 IM SUSP
0.5000 mL | Freq: Once | INTRAMUSCULAR | Status: AC
  Administered 2016-08-17: 0.5 mL via INTRAMUSCULAR
  Filled 2016-08-17: qty 0.5

## 2016-08-17 MED ORDER — LIDOCAINE-EPINEPHRINE 2 %-1:200000 IJ SOLN
10.0000 mL | Freq: Once | INTRAMUSCULAR | Status: AC
Start: 2016-08-17 — End: 2016-08-17
  Administered 2016-08-17: 10 mL
  Filled 2016-08-17: qty 20

## 2016-08-17 NOTE — Discharge Instructions (Signed)
It was our pleasure to provide your ER care today - we hope that you feel better.  Keep wounds very clean.  Have sutures removed, your doctor or urgent care, in 1 week.  Take tylenol/advil as need.  Follow up with primary care doctor in 1 week.  Return to ER if worse, new symptoms, fevers, trouble breathing, or other medical emergency.

## 2016-08-17 NOTE — ED Provider Notes (Signed)
[facility]-EMERGENCY DEPT Provider Note   CSN: [id number] Arrival date & time: 08/17/16  0454     History   Chief Complaint Chief Complaint  Patient presents with  . Fall  . Facial Laceration    HPI Bradley Fleming is a 71 y.o. male.  Patient w hx Parkinsons, s/p fall at home this AM.  States lost balance in bathroom, hit face/head on sink. No loc. C/o pain localized to face, and low back. Pain constant, dull, moderate, non radiating. Denies severe headache. No nv. No neck or upper back pain. Patient denies feeling faint or dizzy. No syncope. Denies chest discomfort or palpitations. No sob. No abd pain. No nv. Denies dysuria or gu c/o. States eating normally. Denies anticoag use.    The history is provided by the patient and the EMS personnel.  Fall  Pertinent negatives include no chest pain, no abdominal pain, no headaches and no shortness of breath.    Past Medical History:  Diagnosis Date  . Abnormality of gait 07/11/2015  . Camptocormia   . Focal dystonia   . Memory disorder 11/09/2015  . Parkinson disease [facility])     Patient Active Problem List   Diagnosis Date Noted  . Memory disorder 11/09/2015  . Parkinson disease (HCC) 07/11/2015  . Abnormality of gait 07/11/2015    Past Surgical History:  Procedure Laterality Date  . ANKLE SURGERY Right   . CATARACT EXTRACTION         Home Medications    Prior to Admission medications   Medication Sig Start Date End Date Taking? Authorizing Provider  b complex vitamins tablet Take 1 tablet by mouth daily.    Historical Provider, MD  carbidopa-levodopa (SINEMET CR) 50-200 MG tablet Take 1 tablet by mouth 3 (three) times daily. 03/15/16   [name], MD  gabapentin (NEURONTIN) 300 MG capsule Take 1 capsule (300 mg total) by mouth 3 (three) times daily. 03/27/16   [name], MD  hydrochlorothiazide (HYDRODIURIL) 25 MG tablet Take 1 tablet (25 mg total) by mouth daily. 02/11/16   Nelva Nay, MD  PARoxetine  (PAXIL) 20 MG tablet  12/03/15   Historical Provider, MD  sildenafil (REVATIO) 20 MG tablet  09/08/15   Historical Provider, MD    Family History Family History  Problem Relation Age of Onset  . Heart failure Mother   . Cirrhosis Father   . Alcohol abuse Father   . Tremor Sister   . COPD Brother   . Diabetes Brother   . Heart failure Brother     Social History Social History  Substance Use Topics  . Smoking status: Former Games developer  . Smokeless tobacco: Never Used  . Alcohol use No     Comment: Former      Allergies   Banana   Review of Systems Review of Systems  Constitutional: Negative for chills and fever.  HENT: Negative for sore throat.   Eyes: Negative for redness.  Respiratory: Negative for shortness of breath.   Cardiovascular: Negative for chest pain, palpitations and leg swelling.  Gastrointestinal: Negative for abdominal pain.  Genitourinary: Negative for flank pain.  Musculoskeletal: Negative for back pain and neck pain.  Skin: Negative for rash.  Neurological: Negative for weakness, numbness and headaches.  Hematological: Does not bruise/bleed easily.  Psychiatric/Behavioral: Negative for confusion.     Physical Exam Updated Vital Signs BP 118/82 (BP Location: Right Arm)   Pulse 98   Temp 98 F (36.7 C) (Oral)   Resp  18   Ht 5\' 5"  (1.651 m)   Wt 56.7 kg   SpO2 100%   BMI 20.80 kg/m   Physical Exam  Constitutional: He appears well-developed and well-nourished. No distress.  HENT:  Mouth/Throat: Oropharynx is clear and moist.  Lacerations to bridge of nose, and left cheek, 3 cm. No fb seen or felt. No septal hematoma. Facial bones/orbits grossly intact/stable.   Eyes: Conjunctivae and EOM are normal. Pupils are equal, round, and reactive to light.  Neck: Normal range of motion. Neck supple. No tracheal deviation present.  No bruits.   Cardiovascular: Normal rate, regular rhythm, normal heart sounds and intact distal pulses.   No murmur  heard. Pulmonary/Chest: Effort normal and breath sounds normal. No accessory muscle usage. No respiratory distress. He exhibits no tenderness.  Abdominal: Soft. Bowel sounds are normal. He exhibits no distension. There is no tenderness.  No abd wall bruising or contusion.  Genitourinary:  Genitourinary Comments: No cva tenderness  Musculoskeletal: He exhibits no edema.  Lumbar tenderness, otherwise, CTLS spine, non tender, aligned, no step off. Good rom bil ext without pain or focal bony tenderness. Distal pulses palp bil.   Neurological: He is alert.  Responds to questions appropriately. Speech clear. Motor intact bil, stre 5/5. sens grossly intact.   Skin: Skin is warm and dry. No rash noted. He is not diaphoretic.  Psychiatric: He has a normal mood and affect.  Nursing note and vitals reviewed.    ED Treatments / Results  Labs (all labs ordered are listed, but only abnormal results are displayed) Results for orders placed or performed during the hospital encounter of 08/17/16  Comprehensive metabolic panel  Result Value Ref Range   Sodium 137 135 - 145 mmol/L   Potassium 5.1 3.5 - 5.1 mmol/L   Chloride 98 (L) 101 - 111 mmol/L   CO2 31 22 - 32 mmol/L   Glucose, Bld 100 (H) 65 - 99 mg/dL   BUN 23 (H) 6 - 20 mg/dL   Creatinine, Ser 1.61 0.61 - 1.24 mg/dL   Calcium 9.8 8.9 - 09.6 mg/dL   Total Protein 7.4 6.5 - 8.1 g/dL   Albumin 4.1 3.5 - 5.0 g/dL   AST 36 15 - 41 U/L   ALT 10 (L) 17 - 63 U/L   Alkaline Phosphatase 79 38 - 126 U/L   Total Bilirubin 0.4 0.3 - 1.2 mg/dL   GFR calc non Af Amer >60 >60 mL/min   GFR calc Af Amer >60 >60 mL/min   Anion gap 8 5 - 15  CBC with Differential  Result Value Ref Range   WBC 5.0 4.0 - 10.5 K/uL   RBC 4.90 4.22 - 5.81 MIL/uL   Hemoglobin 12.5 (L) 13.0 - 17.0 g/dL   HCT 04.5 (L) 40.9 - 81.1 %   MCV 76.3 (L) 78.0 - 100.0 fL   MCH 25.5 (L) 26.0 - 34.0 pg   MCHC 33.4 30.0 - 36.0 g/dL   RDW 91.4 78.2 - 95.6 %   Platelets 211 150 - 400  K/uL   Neutrophils Relative % 62 %   Neutro Abs 3.1 1.7 - 7.7 K/uL   Lymphocytes Relative 27 %   Lymphs Abs 1.3 0.7 - 4.0 K/uL   Monocytes Relative 10 %   Monocytes Absolute 0.5 0.1 - 1.0 K/uL   Eosinophils Relative 1 %   Eosinophils Absolute 0.1 0.0 - 0.7 K/uL   Basophils Relative 0 %   Basophils Absolute 0.0 0.0 - 0.1 K/uL  Ct Head Wo Contrast  Result Date: 08/17/2016 CLINICAL DATA:  Unwitnessed fall at home.  Struck face on sink. EXAM: CT HEAD WITHOUT CONTRAST CT MAXILLOFACIAL WITHOUT CONTRAST CT CERVICAL SPINE WITHOUT CONTRAST TECHNIQUE: Multidetector CT imaging of the head, cervical spine, and maxillofacial structures were performed using the standard protocol without intravenous contrast. Multiplanar CT image reconstructions of the cervical spine and maxillofacial structures were also generated. COMPARISON:  Head CT 02/11/2016 FINDINGS: CT HEAD FINDINGS Brain: No evidence of acute infarction, hemorrhage, hydrocephalus, extra-axial collection or mass lesion/mass effect. Stable atrophy and chronic small vessel ischemia. Vascular: No hyperdense vessel or unexpected calcification. Skull: Normal. Negative for fracture or focal lesion. Other: None. CT MAXILLOFACIAL FINDINGS Osseous: Probable nondisplaced right nasal bone fracture. Nasal septum is midline. No additional acute fracture. Mandibles are intact. Temporomandibular joints remain congruent. Orbits: Remote fracture medial left orbital wall. No acute orbital fracture. Both globes are intact. Post bilateral cataract resection. Sinuses: Clear. Soft tissues: Faint right infraorbital soft tissue edema. No radiopaque foreign body. No confluent hematoma. CT CERVICAL SPINE FINDINGS Alignment: Straightening of normal lordosis. No listhesis, jumped or perched facets. Skull base and vertebrae: No acute fracture. The dens is intact. No primary bone lesion or focal pathologic process. Soft tissues and spinal canal: No prevertebral fluid or swelling. No  visible canal hematoma. Disc levels: Disc space narrowing and endplate spurring from C3-C4 through C5-C6. Scattered facet arthropathy. Upper chest: No acute abnormality. Other: Carotid vascular calcifications. IMPRESSION: 1. No acute intracranial abnormality. Stable atrophy and chronic small vessel ischemia. 2. Probable acute nondisplaced right nasal bone fracture. No additional acute face fracture. Remote fracture of the medial left orbit. 3. No acute fracture or subluxation of the cervical spine. Mild degenerative change. Electronically Signed   By: Rubye Oaks M.D.   On: 08/17/2016 07:02   Ct Cervical Spine Wo Contrast  Result Date: 08/17/2016 CLINICAL DATA:  Unwitnessed fall at home.  Struck face on sink. EXAM: CT HEAD WITHOUT CONTRAST CT MAXILLOFACIAL WITHOUT CONTRAST CT CERVICAL SPINE WITHOUT CONTRAST TECHNIQUE: Multidetector CT imaging of the head, cervical spine, and maxillofacial structures were performed using the standard protocol without intravenous contrast. Multiplanar CT image reconstructions of the cervical spine and maxillofacial structures were also generated. COMPARISON:  Head CT 02/11/2016 FINDINGS: CT HEAD FINDINGS Brain: No evidence of acute infarction, hemorrhage, hydrocephalus, extra-axial collection or mass lesion/mass effect. Stable atrophy and chronic small vessel ischemia. Vascular: No hyperdense vessel or unexpected calcification. Skull: Normal. Negative for fracture or focal lesion. Other: None. CT MAXILLOFACIAL FINDINGS Osseous: Probable nondisplaced right nasal bone fracture. Nasal septum is midline. No additional acute fracture. Mandibles are intact. Temporomandibular joints remain congruent. Orbits: Remote fracture medial left orbital wall. No acute orbital fracture. Both globes are intact. Post bilateral cataract resection. Sinuses: Clear. Soft tissues: Faint right infraorbital soft tissue edema. No radiopaque foreign body. No confluent hematoma. CT CERVICAL SPINE FINDINGS  Alignment: Straightening of normal lordosis. No listhesis, jumped or perched facets. Skull base and vertebrae: No acute fracture. The dens is intact. No primary bone lesion or focal pathologic process. Soft tissues and spinal canal: No prevertebral fluid or swelling. No visible canal hematoma. Disc levels: Disc space narrowing and endplate spurring from C3-C4 through C5-C6. Scattered facet arthropathy. Upper chest: No acute abnormality. Other: Carotid vascular calcifications. IMPRESSION: 1. No acute intracranial abnormality. Stable atrophy and chronic small vessel ischemia. 2. Probable acute nondisplaced right nasal bone fracture. No additional acute face fracture. Remote fracture of the medial left orbit. 3. No acute fracture  or subluxation of the cervical spine. Mild degenerative change. Electronically Signed   By: Rubye Oaks M.D.   On: 08/17/2016 07:02   Ct Maxillofacial Wo Cm  Result Date: 08/17/2016 CLINICAL DATA:  Unwitnessed fall at home.  Struck face on sink. EXAM: CT HEAD WITHOUT CONTRAST CT MAXILLOFACIAL WITHOUT CONTRAST CT CERVICAL SPINE WITHOUT CONTRAST TECHNIQUE: Multidetector CT imaging of the head, cervical spine, and maxillofacial structures were performed using the standard protocol without intravenous contrast. Multiplanar CT image reconstructions of the cervical spine and maxillofacial structures were also generated. COMPARISON:  Head CT 02/11/2016 FINDINGS: CT HEAD FINDINGS Brain: No evidence of acute infarction, hemorrhage, hydrocephalus, extra-axial collection or mass lesion/mass effect. Stable atrophy and chronic small vessel ischemia. Vascular: No hyperdense vessel or unexpected calcification. Skull: Normal. Negative for fracture or focal lesion. Other: None. CT MAXILLOFACIAL FINDINGS Osseous: Probable nondisplaced right nasal bone fracture. Nasal septum is midline. No additional acute fracture. Mandibles are intact. Temporomandibular joints remain congruent. Orbits: Remote fracture  medial left orbital wall. No acute orbital fracture. Both globes are intact. Post bilateral cataract resection. Sinuses: Clear. Soft tissues: Faint right infraorbital soft tissue edema. No radiopaque foreign body. No confluent hematoma. CT CERVICAL SPINE FINDINGS Alignment: Straightening of normal lordosis. No listhesis, jumped or perched facets. Skull base and vertebrae: No acute fracture. The dens is intact. No primary bone lesion or focal pathologic process. Soft tissues and spinal canal: No prevertebral fluid or swelling. No visible canal hematoma. Disc levels: Disc space narrowing and endplate spurring from C3-C4 through C5-C6. Scattered facet arthropathy. Upper chest: No acute abnormality. Other: Carotid vascular calcifications. IMPRESSION: 1. No acute intracranial abnormality. Stable atrophy and chronic small vessel ischemia. 2. Probable acute nondisplaced right nasal bone fracture. No additional acute face fracture. Remote fracture of the medial left orbit. 3. No acute fracture or subluxation of the cervical spine. Mild degenerative change. Electronically Signed   By: Rubye Oaks M.D.   On: 08/17/2016 07:02    EKG  EKG Interpretation None       Radiology Ct Head Wo Contrast  Result Date: 08/17/2016 CLINICAL DATA:  Unwitnessed fall at home.  Struck face on sink. EXAM: CT HEAD WITHOUT CONTRAST CT MAXILLOFACIAL WITHOUT CONTRAST CT CERVICAL SPINE WITHOUT CONTRAST TECHNIQUE: Multidetector CT imaging of the head, cervical spine, and maxillofacial structures were performed using the standard protocol without intravenous contrast. Multiplanar CT image reconstructions of the cervical spine and maxillofacial structures were also generated. COMPARISON:  Head CT 02/11/2016 FINDINGS: CT HEAD FINDINGS Brain: No evidence of acute infarction, hemorrhage, hydrocephalus, extra-axial collection or mass lesion/mass effect. Stable atrophy and chronic small vessel ischemia. Vascular: No hyperdense vessel or  unexpected calcification. Skull: Normal. Negative for fracture or focal lesion. Other: None. CT MAXILLOFACIAL FINDINGS Osseous: Probable nondisplaced right nasal bone fracture. Nasal septum is midline. No additional acute fracture. Mandibles are intact. Temporomandibular joints remain congruent. Orbits: Remote fracture medial left orbital wall. No acute orbital fracture. Both globes are intact. Post bilateral cataract resection. Sinuses: Clear. Soft tissues: Faint right infraorbital soft tissue edema. No radiopaque foreign body. No confluent hematoma. CT CERVICAL SPINE FINDINGS Alignment: Straightening of normal lordosis. No listhesis, jumped or perched facets. Skull base and vertebrae: No acute fracture. The dens is intact. No primary bone lesion or focal pathologic process. Soft tissues and spinal canal: No prevertebral fluid or swelling. No visible canal hematoma. Disc levels: Disc space narrowing and endplate spurring from C3-C4 through C5-C6. Scattered facet arthropathy. Upper chest: No acute abnormality. Other: Carotid vascular calcifications.  IMPRESSION: 1. No acute intracranial abnormality. Stable atrophy and chronic small vessel ischemia. 2. Probable acute nondisplaced right nasal bone fracture. No additional acute face fracture. Remote fracture of the medial left orbit. 3. No acute fracture or subluxation of the cervical spine. Mild degenerative change. Electronically Signed   By: Rubye OaksMelanie  Ehinger M.D.   On: 08/17/2016 07:02   Ct Cervical Spine Wo Contrast  Result Date: 08/17/2016 CLINICAL DATA:  Unwitnessed fall at home.  Struck face on sink. EXAM: CT HEAD WITHOUT CONTRAST CT MAXILLOFACIAL WITHOUT CONTRAST CT CERVICAL SPINE WITHOUT CONTRAST TECHNIQUE: Multidetector CT imaging of the head, cervical spine, and maxillofacial structures were performed using the standard protocol without intravenous contrast. Multiplanar CT image reconstructions of the cervical spine and maxillofacial structures were also  generated. COMPARISON:  Head CT 02/11/2016 FINDINGS: CT HEAD FINDINGS Brain: No evidence of acute infarction, hemorrhage, hydrocephalus, extra-axial collection or mass lesion/mass effect. Stable atrophy and chronic small vessel ischemia. Vascular: No hyperdense vessel or unexpected calcification. Skull: Normal. Negative for fracture or focal lesion. Other: None. CT MAXILLOFACIAL FINDINGS Osseous: Probable nondisplaced right nasal bone fracture. Nasal septum is midline. No additional acute fracture. Mandibles are intact. Temporomandibular joints remain congruent. Orbits: Remote fracture medial left orbital wall. No acute orbital fracture. Both globes are intact. Post bilateral cataract resection. Sinuses: Clear. Soft tissues: Faint right infraorbital soft tissue edema. No radiopaque foreign body. No confluent hematoma. CT CERVICAL SPINE FINDINGS Alignment: Straightening of normal lordosis. No listhesis, jumped or perched facets. Skull base and vertebrae: No acute fracture. The dens is intact. No primary bone lesion or focal pathologic process. Soft tissues and spinal canal: No prevertebral fluid or swelling. No visible canal hematoma. Disc levels: Disc space narrowing and endplate spurring from C3-C4 through C5-C6. Scattered facet arthropathy. Upper chest: No acute abnormality. Other: Carotid vascular calcifications. IMPRESSION: 1. No acute intracranial abnormality. Stable atrophy and chronic small vessel ischemia. 2. Probable acute nondisplaced right nasal bone fracture. No additional acute face fracture. Remote fracture of the medial left orbit. 3. No acute fracture or subluxation of the cervical spine. Mild degenerative change. Electronically Signed   By: Rubye OaksMelanie  Ehinger M.D.   On: 08/17/2016 07:02   Ct Maxillofacial Wo Cm  Result Date: 08/17/2016 CLINICAL DATA:  Unwitnessed fall at home.  Struck face on sink. EXAM: CT HEAD WITHOUT CONTRAST CT MAXILLOFACIAL WITHOUT CONTRAST CT CERVICAL SPINE WITHOUT CONTRAST  TECHNIQUE: Multidetector CT imaging of the head, cervical spine, and maxillofacial structures were performed using the standard protocol without intravenous contrast. Multiplanar CT image reconstructions of the cervical spine and maxillofacial structures were also generated. COMPARISON:  Head CT 02/11/2016 FINDINGS: CT HEAD FINDINGS Brain: No evidence of acute infarction, hemorrhage, hydrocephalus, extra-axial collection or mass lesion/mass effect. Stable atrophy and chronic small vessel ischemia. Vascular: No hyperdense vessel or unexpected calcification. Skull: Normal. Negative for fracture or focal lesion. Other: None. CT MAXILLOFACIAL FINDINGS Osseous: Probable nondisplaced right nasal bone fracture. Nasal septum is midline. No additional acute fracture. Mandibles are intact. Temporomandibular joints remain congruent. Orbits: Remote fracture medial left orbital wall. No acute orbital fracture. Both globes are intact. Post bilateral cataract resection. Sinuses: Clear. Soft tissues: Faint right infraorbital soft tissue edema. No radiopaque foreign body. No confluent hematoma. CT CERVICAL SPINE FINDINGS Alignment: Straightening of normal lordosis. No listhesis, jumped or perched facets. Skull base and vertebrae: No acute fracture. The dens is intact. No primary bone lesion or focal pathologic process. Soft tissues and spinal canal: No prevertebral fluid or swelling. No visible canal  hematoma. Disc levels: Disc space narrowing and endplate spurring from C3-C4 through C5-C6. Scattered facet arthropathy. Upper chest: No acute abnormality. Other: Carotid vascular calcifications. IMPRESSION: 1. No acute intracranial abnormality. Stable atrophy and chronic small vessel ischemia. 2. Probable acute nondisplaced right nasal bone fracture. No additional acute face fracture. Remote fracture of the medial left orbit. 3. No acute fracture or subluxation of the cervical spine. Mild degenerative change. Electronically Signed    By: Rubye Oaks M.D.   On: 08/17/2016 07:02    Procedures .Marland KitchenLaceration Repair Date/Time: 08/17/2016 9:38 AM Performed by: Cathren Laine Authorized by: Cathren Laine   Consent:    Consent obtained:  Verbal Anesthesia (see MAR for exact dosages):    Anesthesia method:  Local infiltration   Local anesthetic:  Lidocaine 2% WITH epi Laceration details:    Location:  Face   Length (cm):  3 Pre-procedure details:    Preparation:  Patient was prepped and draped in usual sterile fashion Treatment:    Area cleansed with:  Betadine   Amount of cleaning:  Standard   Irrigation solution:  Sterile saline   Visualized foreign bodies/material removed: no   Skin repair:    Repair method:  Sutures   Suture size:  6-0   Suture material:  Prolene   Suture technique:  Simple interrupted   Number of sutures:  7 Post-procedure details:    Dressing:  Antibiotic ointment   Patient tolerance of procedure:  Tolerated well, no immediate complications   (including critical care time)  Medications Ordered in ED Medications  lidocaine-EPINEPHrine (XYLOCAINE W/EPI) 2 %-1:200000 (PF) injection 10 mL (not administered)     Initial Impression / Assessment and Plan / ED Course  I have reviewed the triage vital signs and the nursing notes.  Pertinent labs & imaging results that were available during my care of the patient were reviewed by me and considered in my medical decision making (see chart for details).    ED workup initiated by nurse/prior provider.  I sutured wounds - see procedure note.   Tylenol po.  Tetanus unknown. Tetanus im.  Recheck remains alert, awake, denies new c/o.   Patient currently appears stable for d/c.    Final Clinical Impressions(s) / ED Diagnoses   Final diagnoses:  None    New Prescriptions New Prescriptions   No medications on file     Cathren Laine, MD 08/17/16 [phone]

## 2016-08-17 NOTE — ED Triage Notes (Signed)
Per EMS: Was called into the home per wife. Pt experienced an unwitnessed fall. Wife heard something and went into the room and he was on the floor. Pt has a hx of Parkinson with increased weakness and incontinence over the past week. Per EMS patient stated he lost his balance. Per wife and patient he denies LOC, did hit face area against sink trying to break fall. Pt denies headache or pain. Some discomfort to the nasal bridge only. Per EMS pt is not on blood thinners. Patient is slow to respond but A&O x4. They recently moved here a little less than a year ago. Per EMS pt has strong foul urine smell, ? UTI. CBG 169, BP 126/86, HR 102, 100%RA. Wife will be here later this morning.

## 2016-08-17 NOTE — ED Notes (Signed)
Patient transported to CT 

## 2016-08-17 NOTE — ED Provider Notes (Signed)
MSE was initiated and I personally evaluated the patient and placed orders (if any) at  05:30 am on August 17, 2016.  The patient appears stable so that the remainder of the MSE may be completed by another provider.  Patient has history of Parkinson's disease and has been getting weak. He fell tonight and hit his head with laceration of the bridge of his nose and on his left cheek underneath his left eye. EMS and caregivers describe that his urine has been getting foul smelling recently and patient seems to be getting weaker. Patient is alert and cooperative. He has slowed delayed movements. Initial CTs were ordered to look for fracture or intracranial injury. A thick laboratory testing was done including a urinalysis.  Devoria AlbeIva Gedalya Jim, MD, Concha PyoFACEP    Lulla Linville, MD 08/17/16 [id number]

## 2016-08-17 NOTE — ED Notes (Signed)
Bed: WA21 Expected date:  Expected time:  Means of arrival:  Comments: Fall, laceration and facial swelling

## 2016-08-17 NOTE — ED Notes (Signed)
ED Provider at bedside. 

## 2016-08-17 NOTE — ED Notes (Signed)
Patient tolerated fluids well without complications. Able to stand and walk around in the room with stand-by assist. Family present at bedside.

## 2016-08-24 DIAGNOSIS — W19XXXA Unspecified fall, initial encounter: Secondary | ICD-10-CM | POA: Diagnosis not present

## 2016-08-24 DIAGNOSIS — G20 Parkinson's disease: Secondary | ICD-10-CM | POA: Diagnosis not present

## 2016-08-24 DIAGNOSIS — S0181XA Laceration without foreign body of other part of head, initial encounter: Secondary | ICD-10-CM | POA: Diagnosis not present

## 2016-08-24 DIAGNOSIS — E46 Unspecified protein-calorie malnutrition: Secondary | ICD-10-CM | POA: Diagnosis not present

## 2016-09-03 DIAGNOSIS — R5382 Chronic fatigue, unspecified: Secondary | ICD-10-CM | POA: Diagnosis not present

## 2016-09-03 DIAGNOSIS — F0391 Unspecified dementia with behavioral disturbance: Secondary | ICD-10-CM | POA: Diagnosis not present

## 2016-09-03 DIAGNOSIS — G20 Parkinson's disease: Secondary | ICD-10-CM | POA: Diagnosis not present

## 2016-09-03 DIAGNOSIS — F444 Conversion disorder with motor symptom or deficit: Secondary | ICD-10-CM | POA: Diagnosis not present

## 2016-09-14 DIAGNOSIS — N3944 Nocturnal enuresis: Secondary | ICD-10-CM | POA: Diagnosis not present

## 2016-09-14 DIAGNOSIS — R350 Frequency of micturition: Secondary | ICD-10-CM | POA: Diagnosis not present

## 2016-09-14 DIAGNOSIS — R32 Unspecified urinary incontinence: Secondary | ICD-10-CM | POA: Diagnosis not present

## 2016-09-14 DIAGNOSIS — R351 Nocturia: Secondary | ICD-10-CM | POA: Diagnosis not present

## 2016-09-17 DIAGNOSIS — M545 Low back pain: Secondary | ICD-10-CM | POA: Diagnosis not present

## 2016-09-17 DIAGNOSIS — M5127 Other intervertebral disc displacement, lumbosacral region: Secondary | ICD-10-CM | POA: Diagnosis not present

## 2016-09-17 DIAGNOSIS — M791 Myalgia: Secondary | ICD-10-CM | POA: Diagnosis not present

## 2016-09-17 DIAGNOSIS — M461 Sacroiliitis, not elsewhere classified: Secondary | ICD-10-CM | POA: Diagnosis not present

## 2016-09-17 DIAGNOSIS — M5137 Other intervertebral disc degeneration, lumbosacral region: Secondary | ICD-10-CM | POA: Diagnosis not present

## 2016-09-19 DIAGNOSIS — M791 Myalgia: Secondary | ICD-10-CM | POA: Diagnosis not present

## 2016-09-19 DIAGNOSIS — M6240 Contracture of muscle, unspecified site: Secondary | ICD-10-CM | POA: Diagnosis not present

## 2016-09-19 DIAGNOSIS — M9903 Segmental and somatic dysfunction of lumbar region: Secondary | ICD-10-CM | POA: Diagnosis not present

## 2016-09-19 DIAGNOSIS — M5137 Other intervertebral disc degeneration, lumbosacral region: Secondary | ICD-10-CM | POA: Diagnosis not present

## 2016-09-19 DIAGNOSIS — M545 Low back pain: Secondary | ICD-10-CM | POA: Diagnosis not present

## 2016-09-19 DIAGNOSIS — M5127 Other intervertebral disc displacement, lumbosacral region: Secondary | ICD-10-CM | POA: Diagnosis not present

## 2016-09-19 DIAGNOSIS — M461 Sacroiliitis, not elsewhere classified: Secondary | ICD-10-CM | POA: Diagnosis not present

## 2016-09-19 DIAGNOSIS — M9901 Segmental and somatic dysfunction of cervical region: Secondary | ICD-10-CM | POA: Diagnosis not present

## 2016-09-20 DIAGNOSIS — F331 Major depressive disorder, recurrent, moderate: Secondary | ICD-10-CM | POA: Diagnosis not present

## 2016-09-20 DIAGNOSIS — F411 Generalized anxiety disorder: Secondary | ICD-10-CM | POA: Diagnosis not present

## 2016-09-21 ENCOUNTER — Observation Stay (HOSPITAL_COMMUNITY): Payer: Medicare Other

## 2016-09-21 ENCOUNTER — Emergency Department (HOSPITAL_COMMUNITY): Payer: Medicare Other

## 2016-09-21 ENCOUNTER — Observation Stay (HOSPITAL_COMMUNITY)
Admission: EM | Admit: 2016-09-21 | Discharge: 2016-09-22 | Disposition: A | Discharge status: Home / Self Care | Payer: Medicare Other | Attending: Internal Medicine | Admitting: Internal Medicine

## 2016-09-21 ENCOUNTER — Encounter (HOSPITAL_COMMUNITY): Payer: Self-pay | Admitting: Emergency Medicine

## 2016-09-21 DIAGNOSIS — R531 Weakness: Secondary | ICD-10-CM | POA: Diagnosis not present

## 2016-09-21 DIAGNOSIS — Z87891 Personal history of nicotine dependence: Secondary | ICD-10-CM | POA: Diagnosis not present

## 2016-09-21 DIAGNOSIS — R2689 Other abnormalities of gait and mobility: Secondary | ICD-10-CM | POA: Diagnosis not present

## 2016-09-21 DIAGNOSIS — F444 Conversion disorder with motor symptom or deficit: Secondary | ICD-10-CM | POA: Insufficient documentation

## 2016-09-21 DIAGNOSIS — R29818 Other symptoms and signs involving the nervous system: Secondary | ICD-10-CM | POA: Diagnosis not present

## 2016-09-21 DIAGNOSIS — G9341 Metabolic encephalopathy: Secondary | ICD-10-CM | POA: Diagnosis not present

## 2016-09-21 DIAGNOSIS — G9340 Encephalopathy, unspecified: Secondary | ICD-10-CM | POA: Diagnosis not present

## 2016-09-21 DIAGNOSIS — G248 Other dystonia: Secondary | ICD-10-CM | POA: Diagnosis not present

## 2016-09-21 DIAGNOSIS — G20 Parkinson's disease: Secondary | ICD-10-CM | POA: Diagnosis present

## 2016-09-21 DIAGNOSIS — R4182 Altered mental status, unspecified: Secondary | ICD-10-CM | POA: Diagnosis not present

## 2016-09-21 DIAGNOSIS — G20A1 Parkinson's disease without dyskinesia, without mention of fluctuations: Secondary | ICD-10-CM | POA: Diagnosis present

## 2016-09-21 DIAGNOSIS — I951 Orthostatic hypotension: Secondary | ICD-10-CM

## 2016-09-21 DIAGNOSIS — F0280 Dementia in other diseases classified elsewhere without behavioral disturbance: Secondary | ICD-10-CM | POA: Diagnosis not present

## 2016-09-21 DIAGNOSIS — R4701 Aphasia: Secondary | ICD-10-CM | POA: Diagnosis not present

## 2016-09-21 DIAGNOSIS — F329 Major depressive disorder, single episode, unspecified: Secondary | ICD-10-CM | POA: Insufficient documentation

## 2016-09-21 DIAGNOSIS — Z91018 Allergy to other foods: Secondary | ICD-10-CM | POA: Insufficient documentation

## 2016-09-21 DIAGNOSIS — R413 Other amnesia: Secondary | ICD-10-CM | POA: Diagnosis present

## 2016-09-21 DIAGNOSIS — I6789 Other cerebrovascular disease: Secondary | ICD-10-CM | POA: Diagnosis not present

## 2016-09-21 LAB — COMPREHENSIVE METABOLIC PANEL
ALBUMIN: 3.6 g/dL (ref 3.5–5.0)
ALT: 37 U/L (ref 17–63)
ANION GAP: 8 (ref 5–15)
AST: 25 U/L (ref 15–41)
Alkaline Phosphatase: 88 U/L (ref 38–126)
BILIRUBIN TOTAL: 0.7 mg/dL (ref 0.3–1.2)
BUN: 20 mg/dL (ref 6–20)
CO2: 29 mmol/L (ref 22–32)
Calcium: 9.5 mg/dL (ref 8.9–10.3)
Chloride: 101 mmol/L (ref 101–111)
Creatinine, Ser: 0.78 mg/dL (ref 0.61–1.24)
GFR calc non Af Amer: >60 mL/min (ref >60)
GLUCOSE: 86 mg/dL (ref 65–99)
POTASSIUM: 4.3 mmol/L (ref 3.5–5.1)
SODIUM: 138 mmol/L (ref 135–145)
TOTAL PROTEIN: 6.7 g/dL (ref 6.5–8.1)

## 2016-09-21 LAB — CBC
HCT: 34.8 % — ABNORMAL LOW (ref 39.0–52.0)
HCT: 33.7 % — ABNORMAL LOW (ref 39.0–52.0)
Hemoglobin: 11.4 g/dL — ABNORMAL LOW (ref 13.0–17.0)
Hemoglobin: 11.2 g/dL — ABNORMAL LOW (ref 13.0–17.0)
MCH: 25.9 pg — ABNORMAL LOW (ref 26.0–34.0)
MCH: 25.9 pg — AB (ref 26.0–34.0)
MCHC: 32.8 g/dL (ref 30.0–36.0)
MCHC: 33.2 g/dL (ref 30.0–36.0)
MCV: 78.9 fL (ref 78.0–100.0)
MCV: 78 fL (ref 78.0–100.0)
PLATELETS: 191 10*3/uL (ref 150–400)
PLATELETS: 179 10*3/uL (ref 150–400)
RBC: 4.41 MIL/uL (ref 4.22–5.81)
RBC: 4.32 MIL/uL (ref 4.22–5.81)
RDW: 14.4 % (ref 11.5–15.5)
RDW: 14.3 % (ref 11.5–15.5)
WBC: 3.9 10*3/uL — ABNORMAL LOW (ref 4.0–10.5)
WBC: 3.8 10*3/uL — AB (ref 4.0–10.5)

## 2016-09-21 LAB — I-STAT CHEM 8, ED
BUN: 25 mg/dL — ABNORMAL HIGH (ref 6–20)
Calcium, Ion: 1.16 mmol/L (ref 1.15–1.40)
Chloride: 98 mmol/L — ABNORMAL LOW (ref 101–111)
Creatinine, Ser: 0.8 mg/dL (ref 0.61–1.24)
Glucose, Bld: 86 mg/dL (ref 65–99)
HEMATOCRIT: 36 % — AB (ref 39.0–52.0)
HEMOGLOBIN: 12.2 g/dL — AB (ref 13.0–17.0)
Potassium: 4.2 mmol/L (ref 3.5–5.1)
SODIUM: 140 mmol/L (ref 135–145)
TCO2: 32 mmol/L (ref 0–100)

## 2016-09-21 LAB — TSH: TSH: 1.756 u[IU]/mL (ref 0.350–4.500)

## 2016-09-21 LAB — CREATININE, SERUM
CREATININE: 0.75 mg/dL (ref 0.61–1.24)
GFR calc non Af Amer: >60 mL/min (ref >60)

## 2016-09-21 LAB — PROTIME-INR
INR: 0.93
PROTHROMBIN TIME: 12.5 s (ref 11.4–15.2)

## 2016-09-21 LAB — DIFFERENTIAL
Basophils Absolute: 0 10*3/uL (ref 0.0–0.1)
Basophils Relative: 1 %
EOS ABS: 0.2 10*3/uL (ref 0.0–0.7)
Eosinophils Relative: 4 %
Lymphocytes Relative: 42 %
Lymphs Abs: 1.6 10*3/uL (ref 0.7–4.0)
Monocytes Absolute: 0.3 10*3/uL (ref 0.1–1.0)
Monocytes Relative: 7 %
NEUTROS PCT: 46 %
Neutro Abs: 1.8 10*3/uL (ref 1.7–7.7)

## 2016-09-21 LAB — I-STAT TROPONIN, ED: Troponin i, poc: 0.01 ng/mL (ref 0.00–0.08)

## 2016-09-21 LAB — URINALYSIS, ROUTINE W REFLEX MICROSCOPIC
BACTERIA UA: NONE SEEN
BILIRUBIN URINE: NEGATIVE
Glucose, UA: NEGATIVE mg/dL
KETONES UR: NEGATIVE mg/dL
LEUKOCYTES UA: NEGATIVE
Nitrite: NEGATIVE
PROTEIN: NEGATIVE mg/dL
SPECIFIC GRAVITY, URINE: 1.015 (ref 1.005–1.030)
pH: 6 (ref 5.0–8.0)

## 2016-09-21 LAB — FOLATE: FOLATE: 15.7 ng/mL (ref >5.9)

## 2016-09-21 LAB — RAPID URINE DRUG SCREEN, HOSP PERFORMED
Amphetamines: NOT DETECTED
Barbiturates: NOT DETECTED
Benzodiazepines: NOT DETECTED
COCAINE: NOT DETECTED
OPIATES: NOT DETECTED
TETRAHYDROCANNABINOL: NOT DETECTED

## 2016-09-21 LAB — ETHANOL: Alcohol, Ethyl (B): <5 mg/dL (ref <5)

## 2016-09-21 LAB — VITAMIN B12: VITAMIN B 12: 1448 pg/mL — AB (ref 180–914)

## 2016-09-21 LAB — APTT: aPTT: 29 seconds (ref 24–36)

## 2016-09-21 MED ORDER — GABAPENTIN 300 MG PO CAPS
300.0000 mg | ORAL_CAPSULE | Freq: Three times a day (TID) | ORAL | Status: DC
  Administered 2016-09-21 – 2016-09-22 (×4): 300 mg via ORAL
  Filled 2016-09-21 (×4): qty 1

## 2016-09-21 MED ORDER — POLYETHYLENE GLYCOL 3350 17 G PO PACK: 17.0000 g | PACK | Freq: Every day | ORAL | Status: DC | PRN

## 2016-09-21 MED ORDER — CARBIDOPA-LEVODOPA ER 50-200 MG PO TBCR
1.0000 | EXTENDED_RELEASE_TABLET | Freq: Three times a day (TID) | ORAL | Status: DC
  Administered 2016-09-21 – 2016-09-22 (×4): 1 via ORAL
  Filled 2016-09-21 (×6): qty 1

## 2016-09-21 MED ORDER — TRAZODONE HCL 50 MG PO TABS: 25.0000 mg | ORAL_TABLET | Freq: Every evening | ORAL | Status: DC | PRN

## 2016-09-21 MED ORDER — MEMANTINE HCL 10 MG PO TABS
10.0000 mg | ORAL_TABLET | Freq: Two times a day (BID) | ORAL | Status: DC
  Administered 2016-09-21 – 2016-09-22 (×3): 10 mg via ORAL
  Filled 2016-09-21 (×4): qty 1

## 2016-09-21 MED ORDER — LORAZEPAM 2 MG/ML IJ SOLN
INTRAMUSCULAR | Status: AC
  Filled 2016-09-21: qty 1

## 2016-09-21 MED ORDER — ONDANSETRON HCL 4 MG/2ML IJ SOLN: 4.0000 mg | Freq: Four times a day (QID) | INTRAMUSCULAR | Status: DC | PRN

## 2016-09-21 MED ORDER — ONDANSETRON HCL 4 MG PO TABS: 4.0000 mg | ORAL_TABLET | Freq: Four times a day (QID) | ORAL | Status: DC | PRN

## 2016-09-21 MED ORDER — ACETAMINOPHEN 325 MG PO TABS: 650.0000 mg | ORAL_TABLET | Freq: Four times a day (QID) | ORAL | Status: DC | PRN

## 2016-09-21 MED ORDER — HEPARIN SODIUM (PORCINE) 5000 UNIT/ML IJ SOLN
5000.0000 [IU] | Freq: Three times a day (TID) | INTRAMUSCULAR | Status: DC
  Administered 2016-09-21 – 2016-09-22 (×4): 5000 [IU] via SUBCUTANEOUS
  Filled 2016-09-21 (×4): qty 1

## 2016-09-21 MED ORDER — LORAZEPAM 2 MG/ML IJ SOLN
1.0000 mg | Freq: Once | INTRAMUSCULAR | Status: AC
Start: 2016-09-21 — End: 2016-09-21
  Administered 2016-09-21: 1 mg via INTRAVENOUS

## 2016-09-21 MED ORDER — ACETAMINOPHEN 650 MG RE SUPP: 650.0000 mg | Freq: Four times a day (QID) | RECTAL | Status: DC | PRN

## 2016-09-21 NOTE — Care Management Obs Status (Signed)
MEDICARE OBSERVATION STATUS NOTIFICATION   Patient Details  Name: Bradley Fleming MRN: [id number] Date of Birth: 02/23/1946   Medicare Observation Status Notification Given:  Yes    Kermit BaloKelli F Vesna Kable, RN 09/21/2016, 2:52 PM

## 2016-09-21 NOTE — ED Provider Notes (Addendum)
[facility]-EMERGENCY DEPT Provider Note   CSN: [id number] Arrival date & time: 09/21/16  0444   An emergency department physician performed an initial assessment on this suspected stroke patient at [id number].  History   Chief Complaint Chief Complaint  Patient presents with  . Code Stroke    HPI Bradley Fleming is a 71 y.o. male.  Patient brought to the emergency department as a possible code stroke. Patient went to bed at 11 PM he was reportedly normal, according to wife. She woke up around 4 or 4:30 AM and noticed that he was confused, not speaking. EMS report that he has been extremely weak, has had very minimal effort to follow commands, unable to talk. He is alert. At arrival, patient does make eye contact but minimal ability to follow commands. Level V Caveat due to non-verbal.      Past Medical History:  Diagnosis Date  . Abnormality of gait 07/11/2015  . Camptocormia   . Focal dystonia   . Memory disorder 11/09/2015  . Parkinson disease [facility](HCC)     Patient Active Problem List   Diagnosis Date Noted  . Encephalopathy acute 09/21/2016  . Memory disorder 11/09/2015  . Parkinson disease (HCC) 07/11/2015  . Abnormality of gait 07/11/2015    Past Surgical History:  Procedure Laterality Date  . ANKLE SURGERY Right   . CATARACT EXTRACTION         Home Medications    Prior to Admission medications   Medication Sig Start Date End Date Taking? Authorizing Provider  memantine (NAMENDA) 10 MG tablet Take 10 mg by mouth 2 (two) times daily. 09/03/16 09/03/17 Yes Historical Provider, MD  busPIRone (BUSPAR) 7.5 MG tablet Take 7.5 mg by mouth 2 (two) times daily. 08/16/16   Historical Provider, MD  carbidopa-levodopa (SINEMET CR) 50-200 MG tablet Take 1 tablet by mouth 3 (three) times daily. 03/15/16   York Spanielharles K Willis, MD  ENSURE (ENSURE) Take 237 mLs by mouth 2 (two) times daily between meals.    Historical Provider, MD  gabapentin (NEURONTIN) 300 MG capsule Take 1 capsule (300 mg  total) by mouth 3 (three) times daily. 03/27/16   York Spanielharles K Willis, MD  hydrochlorothiazide (HYDRODIURIL) 25 MG tablet Take 1 tablet (25 mg total) by mouth daily. Patient taking differently: Take 25 mg by mouth daily with breakfast.  02/11/16   Nelva Nayobert Beaton, MD  ibuprofen (ADVIL,MOTRIN) 200 MG tablet Take 400 mg by mouth every 4 (four) hours as needed for fever, headache, mild pain, moderate pain or cramping.    Historical Provider, MD  ketotifen (ALLERGY EYE DROPS) 0.025 % ophthalmic solution Place 1-2 drops into both eyes daily as needed (for allergies).    Historical Provider, MD  PARoxetine (PAXIL) 40 MG tablet Take 40 mg by mouth daily with breakfast.    Historical Provider, MD  selegiline (ELDEPRYL) 5 MG tablet Take 5 mg by mouth 2 (two) times daily. 07/20/16   Historical Provider, MD    Family History Family History  Problem Relation Age of Onset  . Heart failure Mother   . Cirrhosis Father   . Alcohol abuse Father   . Tremor Sister   . COPD Brother   . Diabetes Brother   . Heart failure Brother     Social History Social History  Substance Use Topics  . Smoking status: Former Games developermoker  . Smokeless tobacco: Never Used  . Alcohol use No     Comment: Former      Allergies   Banana  Review of Systems Review of Systems  Unable to perform ROS: Patient nonverbal     Physical Exam Updated Vital Signs BP 123/78   Pulse 80   Temp 97.4 F (36.3 C) (Axillary)   Resp 10   Wt 123 lb 0.3 oz (55.8 kg)   SpO2 100%   BMI 20.47 kg/m   Physical Exam  Constitutional: He appears well-developed and well-nourished. No distress.  HENT:  Head: Normocephalic and atraumatic.  Right Ear: Hearing normal.  Left Ear: Hearing normal.  Nose: Nose normal.  Mouth/Throat: Oropharynx is clear and moist and mucous membranes are normal.  Eyes: Conjunctivae and EOM are normal. Pupils are equal, round, and reactive to light.  Neck: Normal range of motion. Neck supple.  Cardiovascular:  Regular rhythm, S1 normal and S2 normal.  Exam reveals no gallop and no friction rub.   No murmur heard. Pulmonary/Chest: Effort normal and breath sounds normal. No respiratory distress. He exhibits no tenderness.  Abdominal: Soft. Normal appearance and bowel sounds are normal. There is no hepatosplenomegaly. There is no tenderness. There is no rebound, no guarding, no tenderness at McBurney's point and negative Murphy's sign. No hernia.  Musculoskeletal: Normal range of motion.  Neurological: He is alert. He has normal strength. No cranial nerve deficit or sensory deficit. Coordination normal. GCS eye subscore is 4. GCS verbal subscore is 4. GCS motor subscore is 6.  Skin: Skin is warm, dry and intact. No rash noted. No cyanosis.  Psychiatric: He has a normal mood and affect. His speech is normal and behavior is normal. Thought content normal.  Nursing note and vitals reviewed.    ED Treatments / Results  Labs (all labs ordered are listed, but only abnormal results are displayed) Labs Reviewed  CBC - Abnormal; Notable for the following:       Result Value   WBC 3.9 (*)    Hemoglobin 11.4 (*)    HCT 34.8 (*)    MCH 25.9 (*)    All other components within normal limits  I-STAT CHEM 8, ED - Abnormal; Notable for the following:    Chloride 98 (*)    BUN 25 (*)    Hemoglobin 12.2 (*)    HCT 36.0 (*)    All other components within normal limits  ETHANOL  PROTIME-INR  APTT  DIFFERENTIAL  COMPREHENSIVE METABOLIC PANEL  RAPID URINE DRUG SCREEN, HOSP PERFORMED  URINALYSIS, ROUTINE W REFLEX MICROSCOPIC  I-STAT TROPOININ, ED    EKG  EKG Interpretation None       Radiology Ct Head Code Stroke W/o Cm  Result Date: 09/21/2016 CLINICAL DATA:  Code stroke.  Bilateral arm weakness. EXAM: CT HEAD WITHOUT CONTRAST TECHNIQUE: Contiguous axial images were obtained from the base of the skull through the vertex without intravenous contrast. COMPARISON:  None. FINDINGS: Brain: No evidence of  acute infarction, hemorrhage, hydrocephalus, extra-axial collection or mass lesion/mass effect. There is periventricular hypoattenuation compatible with chronic microvascular disease. There is diffuse atrophy, greater than expected for age. Vascular: No hyperdense vessel or unexpected calcification. Skull: Normal. Negative for fracture or focal lesion. Sinuses/Orbits: The visualized portions of the paranasal sinuses and mastoid air cells are free of fluid. No advanced mucosal thickening. The visualized orbits are normal. Other: None ASPECTS (Alberta Stroke Program Early CT Score) - Ganglionic level infarction (caudate, lentiform nuclei, internal capsule, insula, M1-M3 cortex): 7 - Supraganglionic infarction (M4-M6 cortex): 3 Total score (0-10 with 10 being normal): 10 IMPRESSION: 1. No acute intracranial abnormality. 2. Chronic microvascular  ischemia and age advanced cerebral atrophy. 3. ASPECTS is 10. These results were called by telephone at the time of interpretation on 09/21/2016 at 5:14 am to Dr. Noel Christmas , who verbally acknowledged these results. Electronically Signed   By: Deatra Robinson M.D.   On: 09/21/2016 05:14    Procedures Procedures (including critical care time)  Medications Ordered in ED Medications  LORazepam (ATIVAN) injection 1 mg (1 mg Intravenous Given 09/21/16 0509)     Initial Impression / Assessment and Plan / ED Course  I have reviewed the triage vital signs and the nursing notes.  Pertinent labs & imaging results that were available during my care of the patient were reviewed by me and considered in my medical decision making (see chart for details).     Patient presents to the emergency department for evaluation of what appears to be acute encephalopathy. He was brought in as a code stroke, did not have any focal neurologic findings. Patient was not able to talk, but it did not appear clearly that he was experiencing aphasia. He did have some tremor, could not rule  out seizure. He was seen in conjunction with Dr. Roseanne Reno, neurology. Screening CT did not show any obvious infarct. He did not feel that the patient was a candidate for any IV or intra-arterial TPA or intervention. Recommended admission for further workup including EEG and MRI. Patient has slowly improved here in the ER, now moving extremities and following commands.  Final Clinical Impressions(s) / ED Diagnoses   Final diagnoses:  Encephalopathy acute    New Prescriptions New Prescriptions   No medications on file     Gilda Crease, MD 09/21/16 4098    Gilda Crease, MD 10/13/16 [phone]

## 2016-09-21 NOTE — Care Management Note (Addendum)
Case Management Note  Patient Details  Name: Bradley GlassingGary David Fleming MRN: [id number] Date of Birth: 09/18/1945  Subjective/Objective:    Pt in with acute encephalopathy. He is from home with his wife.                 Action/Plan: Plan is to return home when medically stable. CM following for d/c needs and physician orders.  Addendum: Wife not in room. Attempted to call on 3 different numbers in system without response. Left message on cell phone voicemail.   Expected Discharge Date:                  Expected Discharge Plan:  Home/Self Care  In-House Referral:     Discharge planning Services     Post Acute Care Choice:    Choice offered to:     DME Arranged:    DME Agency:     HH Arranged:    HH Agency:     Status of Service:  In process, will continue to follow  If discussed at Long Length of Stay Meetings, dates discussed:    Additional Comments:  Kermit BaloKelli F Kristine Tiley, RN 09/21/2016, 1:31 PM

## 2016-09-21 NOTE — Progress Notes (Signed)
EEG Completed; Results Pending  

## 2016-09-21 NOTE — ED Triage Notes (Signed)
pt presents to ER from home with [facility] for stroke-like symptoms that were discovered by his wife; LSW was 2300 when he went to bed and she tried to wake him at 0400 and he would not speak and also had generalized weakness
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
## 2016-09-21 NOTE — Evaluation (Signed)
Clinical/Bedside Swallow Evaluation Patient Details  Name: Bradley Fleming MRN: [id number] Date of Birth: 03/09/1946  Today's Date: 09/21/2016 Time: SLP Start Time (ACUTE ONLY): 1347 SLP Stop Time (ACUTE ONLY): 1405 SLP Time Calculation (min) (ACUTE ONLY): 18 min  Past Medical History:  Past Medical History:  Diagnosis Date  . Abnormality of gait 07/11/2015  . Camptocormia   . Focal dystonia   . Memory disorder 11/09/2015  . Parkinson disease [facility](HCC)    Past Surgical History:  Past Surgical History:  Procedure Laterality Date  . ANKLE SURGERY Right   . CATARACT EXTRACTION     HPI:  71 y.o.maleadmitted with AMS. MRI negative for acute findings. PMH: Parkinson's disease, memory disorder, camptocormia and focal dystonia   Assessment / Plan / Recommendation Clinical Impression  Pt initially was drowsy and minimally conversant, but did become more alert and communicative with upright positioning and increased stimulation. Hand over hand assist provided for self-feeding. He has oral tremors and decreased labial movement on the right side. No overt s/s of aspiration are noted, although orally he has some holding, prolonged mastication, and residuals that require Mod cues for acceptance/clearance. Recommend initiation of Dys 1 diet and thin liquids with need for full supervision. Will f/u for tolerance and potential advancement as medical w/u continues. SLP Visit Diagnosis: Dysphagia, unspecified (R13.10)    Aspiration Risk  Mild aspiration risk;Moderate aspiration risk    Diet Recommendation Dysphagia 1 (Puree);Thin liquid   Liquid Administration via: Cup;Straw Medication Administration: Crushed with puree Supervision: Staff to assist with self feeding;Full supervision/cueing for compensatory strategies Compensations: Slow rate;Small sips/bites;Minimize environmental distractions;Follow solids with liquid Postural Changes: Seated upright at 90 degrees;Remain upright for at least 30  minutes after po intake    Other  Recommendations Oral Care Recommendations: Oral care BID   Follow up Recommendations  (tba)      Frequency and Duration min 2x/week  2 weeks       Prognosis Prognosis for Safe Diet Advancement: Good Barriers to Reach Goals: Cognitive deficits      Swallow Study   General HPI: 71 y.o.maleadmitted with AMS. MRI negative for acute findings. PMH: Parkinson's disease, memory disorder, camptocormia and focal dystonia Type of Study: Bedside Swallow Evaluation Previous Swallow Assessment: none in chart Diet Prior to this Study: NPO Temperature Spikes Noted: No Respiratory Status: Room air History of Recent Intubation: No Behavior/Cognition: Cooperative;Confused;Requires cueing;Lethargic/Drowsy Oral Cavity Assessment: Dry Oral Care Completed by SLP: No Oral Cavity - Dentition: Adequate natural dentition;Missing dentition Self-Feeding Abilities: Total assist Patient Positioning: Upright in bed Baseline Vocal Quality: Low vocal intensity Volitional Swallow: Able to elicit    Oral/Motor/Sensory Function Overall Oral Motor/Sensory Function: Generalized oral weakness (oral tremor) Facial ROM: Reduced right Facial Symmetry: Abnormal symmetry right   Ice Chips Ice chips: Impaired Presentation: Spoon Oral Phase Functional Implications: Oral holding   Thin Liquid Thin Liquid: Impaired Presentation: Cup;Self Fed;Straw Oral Phase Functional Implications: Oral holding    Nectar Thick Nectar Thick Liquid: Not tested   Honey Thick Honey Thick Liquid: Not tested   Puree Puree: Impaired Presentation: Spoon Oral Phase Functional Implications: Prolonged oral transit   Solid   GO   Solid: Impaired Oral Phase Functional Implications: Impaired mastication;Oral residue;Prolonged oral transit    Functional Assessment Tool Used: skilled clinical judgment Functional Limitations: Swallowing Swallow Current Status ([id number](G8996): At least 40 percent but less than  60 percent impaired, limited or restricted Swallow Goal Status [phone](G8997): At least 20 percent but less than 40 percent impaired, limited  or restricted   Maxcine Ham 09/21/2016,2:30 PM  Maxcine Ham, M.A. CCC-SLP [phone]

## 2016-09-21 NOTE — Procedures (Signed)
ELECTROENCEPHALOGRAM REPORT  Date of Study: 09/21/2016  Patient's Name: Bradley Fleming MRN: [id number] Date of Birth: 07/11/1946  Referring Provider: Dr. Jaci Carrelhristopher Pollina  Clinical History: This is a [age] year old man with altered mental status.  Medications: acetaminophen (TYLENOL) tablet 650 mg  ondansetron (ZOFRAN) injection 4 mg  traZODone (DESYREL) tablet 25 mg   Technical Summary: A multichannel digital EEG recording measured by the international 10-20 system with electrodes applied with paste and impedances below 5000 ohms performed as portable with EKG monitoring in an awake and asleep patient.  Hyperventilation and photic stimulation were not performed.  The digital EEG was referentially recorded, reformatted, and digitally filtered in a variety of bipolar and referential montages for optimal display.   Description: The patient is awake and asleep during the recording. The record is limited by muscle artifact obscuring the EEG during wakefulness, in between artifact there is no clear posterior dominant rhythm seen. There is a small amount of diffuse 4-5 Hz theta and 2-3 Hz delta slowing of the background.  During drowsiness and sleep, there is an increase in theta and delta slowing of the background with a poorly formed vertex wave seen.  Hyperventilation and photic stimulation were not performed.  There were no epileptiform discharges or electrographic seizures seen.    EKG lead was unremarkable.  Impression: This awake and asleep EEG is abnormal due to mild diffuse slowing of the background.  Clinical Correlation of the above findings indicates diffuse cerebral dysfunction that is non-specific in etiology and can be seen with hypoxic/ischemic injury, toxic/metabolic encephalopathies, neurodegenerative disorders, or medication effect. The study is slightly limited by muscle artifact obscuring EEG during wakefulness, in between artifact there were no focal abnormalities seen.   The absence of epileptiform discharges does not rule out a clinical diagnosis of epilepsy.  Clinical correlation is advised.   Patrcia DollyKaren Aquino, M.D.

## 2016-09-21 NOTE — Consult Note (Signed)
Admission H&P    Chief Complaint: Altered mental status.  HPI: Bradley Fleming is an 71 y.o. male with a history of Parkinson's disease, memory disorder, facial dystonia and abnormal gait, brought to the ED in code stroke status with on responsiveness verbally. Patient was last known well at 2300. He was noted to stare but not follow commands and had no verbal output, including to his spouse. CT scan of his head showed no acute intracranial abnormality. He was given 1 mg of Ativan IV for possible new onset seizure activity.There was no change in his exam other than slight drowsiness. No focal deficits were noted.  Laboratory studies were unremarkable. Urine drug screen is pending. He was a febrile. Code stroke was canceled. Patient is being admitted for altered mental status evaluation.  Past Medical History:  Diagnosis Date  . Abnormality of gait 07/11/2015  . Camptocormia   . Focal dystonia   . Memory disorder 11/09/2015  . Parkinson disease [facility])     Past Surgical History:  Procedure Laterality Date  . ANKLE SURGERY Right   . CATARACT EXTRACTION      Family History  Problem Relation Age of Onset  . Heart failure Mother   . Cirrhosis Father   . Alcohol abuse Father   . Tremor Sister   . COPD Brother   . Diabetes Brother   . Heart failure Brother    Social History:  reports that he has quit smoking. He has never used smokeless tobacco. He reports that he does not drink alcohol or use drugs.  Allergies:  Allergies  Allergen Reactions  . Banana Hives    Medications: Preadmission medications were reviewed by me.  ROS: Unavailable due to mental status changes.  Physical Examination: Blood pressure 163/88, pulse 70, temperature 97.4 F (36.3 C), temperature source Axillary, resp. rate 15, weight 55.8 kg (123 lb 0.3 oz), SpO2 99 %.  HEENT-  Normocephalic, no lesions, without obvious abnormality.  Normal external eye and conjunctiva.  Normal TM's bilaterally.  Normal  auditory canals and external ears. Normal external nose, mucus membranes and septum.  Normal pharynx. Neck supple with no masses, nodes, nodules or enlargement. Cardiovascular - regular rate and rhythm, S1, S2 normal, no murmur, click, rub or gallop Lungs - chest clear, no wheezing, rales, normal symmetric air entry Abdomen - soft, non-tender; bowel sounds normal; no masses,  no organomegaly Extremities - no joint deformities, effusion, or inflammation  Neurologic Examination: Mental Status: Drowsy following Ativan administration, no verbal output, no acute distress. Patient did not follow any commands other than appropriate eye movements. Cranial Nerves: II-Visual fields were intact to confrontation. III/IV/VI-Pupils were equal and reacted. Extraocular movements were full and conjugate.    VII-no facial weakness; essentially continuous bilateral twitches of lower face. VIII-intact. X-no speech output. Motor: No voluntary movements of extremities; flaccid muscle tone of extremities throughout. Deep Tendon Reflexes: 1+ and symmetric. Plantars: Mute bilaterally Carotid auscultation: Normal  Results for orders placed or performed during the hospital encounter of 09/21/16 (from the past 48 hour(s))  Protime-INR     Status: None   Collection Time: 09/21/16  4:50 AM  Result Value Ref Range   Prothrombin Time 12.5 11.4 - 15.2 seconds   INR 0.93   APTT     Status: None   Collection Time: 09/21/16  4:50 AM  Result Value Ref Range   aPTT 29 24 - 36 seconds  CBC     Status: Abnormal   Collection Time: 09/21/16  4:50 AM  Result Value Ref Range   WBC 3.9 (L) 4.0 - 10.5 K/uL   RBC 4.41 4.22 - 5.81 MIL/uL   Hemoglobin 11.4 (L) 13.0 - 17.0 g/dL   HCT 34.8 (L) 39.0 - 52.0 %   MCV 78.9 78.0 - 100.0 fL   MCH 25.9 (L) 26.0 - 34.0 pg   MCHC 32.8 30.0 - 36.0 g/dL   RDW 14.4 11.5 - 15.5 %   Platelets 191 150 - 400 K/uL  Differential     Status: None   Collection Time: 09/21/16  4:50 AM  Result  Value Ref Range   Neutrophils Relative % 46 %   Neutro Abs 1.8 1.7 - 7.7 K/uL   Lymphocytes Relative 42 %   Lymphs Abs 1.6 0.7 - 4.0 K/uL   Monocytes Relative 7 %   Monocytes Absolute 0.3 0.1 - 1.0 K/uL   Eosinophils Relative 4 %   Eosinophils Absolute 0.2 0.0 - 0.7 K/uL   Basophils Relative 1 %   Basophils Absolute 0.0 0.0 - 0.1 K/uL  Comprehensive metabolic panel     Status: None   Collection Time: 09/21/16  4:50 AM  Result Value Ref Range   Sodium 138 135 - 145 mmol/L   Potassium 4.3 3.5 - 5.1 mmol/L   Chloride 101 101 - 111 mmol/L   CO2 29 22 - 32 mmol/L   Glucose, Bld 86 65 - 99 mg/dL   BUN 20 6 - 20 mg/dL   Creatinine, Ser 0.78 0.61 - 1.24 mg/dL   Calcium 9.5 8.9 - 10.3 mg/dL   Total Protein 6.7 6.5 - 8.1 g/dL   Albumin 3.6 3.5 - 5.0 g/dL   AST 25 15 - 41 U/L   ALT 37 17 - 63 U/L   Alkaline Phosphatase 88 38 - 126 U/L   Total Bilirubin 0.7 0.3 - 1.2 mg/dL   GFR calc non Af Amer >60 >60 mL/min   GFR calc Af Amer >60 >60 mL/min    Comment: (NOTE) The eGFR has been calculated using the CKD EPI equation. This calculation has not been validated in all clinical situations. eGFR's persistently <60 mL/min signify possible Chronic Kidney Disease.    Anion gap 8 5 - 15  I-stat troponin, ED (not at [facility], [facility])     Status: None   Collection Time: 09/21/16  4:52 AM  Result Value Ref Range   Troponin i, poc 0.01 0.00 - 0.08 ng/mL   Comment 3            Comment: Due to the release kinetics of cTnI, a negative result within the first hours of the onset of symptoms does not rule out myocardial infarction with certainty. If myocardial infarction is still suspected, repeat the test at appropriate intervals.   I-Stat Chem 8, ED  (not at [facility], [facility])     Status: Abnormal   Collection Time: 09/21/16  4:54 AM  Result Value Ref Range   Sodium 140 135 - 145 mmol/L   Potassium 4.2 3.5 - 5.1 mmol/L   Chloride 98 (L) 101 - 111 mmol/L   BUN 25 (H) 6 - 20 mg/dL   Creatinine, Ser 0.80  0.61 - 1.24 mg/dL   Glucose, Bld 86 65 - 99 mg/dL   Calcium, Ion 1.16 1.15 - 1.40 mmol/L   TCO2 32 0 - 100 mmol/L   Hemoglobin 12.2 (L) 13.0 - 17.0 g/dL   HCT 36.0 (L) 39.0 - 52.0 %  Ethanol     Status:   None   Collection Time: 09/21/16  4:55 AM  Result Value Ref Range   Alcohol, Ethyl (B) <5 <5 mg/dL    Comment:        LOWEST DETECTABLE LIMIT FOR SERUM ALCOHOL IS 5 mg/dL FOR MEDICAL PURPOSES ONLY    Ct Head Code Stroke W/o Cm  Result Date: 09/21/2016 CLINICAL DATA:  Code stroke.  Bilateral arm weakness. EXAM: CT HEAD WITHOUT CONTRAST TECHNIQUE: Contiguous axial images were obtained from the base of the skull through the vertex without intravenous contrast. COMPARISON:  None. FINDINGS: Brain: No evidence of acute infarction, hemorrhage, hydrocephalus, extra-axial collection or mass lesion/mass effect. There is periventricular hypoattenuation compatible with chronic microvascular disease. There is diffuse atrophy, greater than expected for age. Vascular: No hyperdense vessel or unexpected calcification. Skull: Normal. Negative for fracture or focal lesion. Sinuses/Orbits: The visualized portions of the paranasal sinuses and mastoid air cells are free of fluid. No advanced mucosal thickening. The visualized orbits are normal. Other: None ASPECTS (Alberta Stroke Program Early CT Score) - Ganglionic level infarction (caudate, lentiform nuclei, internal capsule, insula, M1-M3 cortex): 7 - Supraganglionic infarction (M4-M6 cortex): 3 Total score (0-10 with 10 being normal): 10 IMPRESSION: 1. No acute intracranial abnormality. 2. Chronic microvascular ischemia and age advanced cerebral atrophy. 3. ASPECTS is 10. These results were called by telephone at the time of interpretation on 09/21/2016 at 5:14 am to Dr.   , who verbally acknowledged these results. Electronically Signed   By: Kevin  Herman M.D.   On: 09/21/2016 05:14    Assessment/Plan 70-year-old man with a history of Parkinson's  disease and memory difficulty, presenting with altered mental status with mutism and apparent worsening of confusion from baseline. No focal deficits were noted. Stroke is unlikely but cannot be ruled out at this point. Laboratory studies were unremarkable, although urine drug screen is pending.  Recommendations: 1. MRI of the brain without contrast 2. EEG, routine adult study 3. Vitamin B-12 and folate levels, RPR and TSH 4. No change in current medications, for now  We will continue to follow this patient with you.  C.R. , MD [facility] [phone]  09/21/2016, 5:46 AM 

## 2016-09-21 NOTE — Progress Notes (Signed)
Mr. Bradley Fleming [facility] with new onset aphasia. Spouse reports to EMS they had a normal conversation at 2300 just prior to going to bed, she woke at 0430 to find him unable to speak. Pt has a hx of Parkinson. Pt LKW 2300, CBG 86, NIHSS 23. Plan to admit to [facility] for further work up.

## 2016-09-22 DIAGNOSIS — G9340 Encephalopathy, unspecified: Secondary | ICD-10-CM | POA: Diagnosis not present

## 2016-09-22 DIAGNOSIS — G20 Parkinson's disease: Secondary | ICD-10-CM | POA: Diagnosis not present

## 2016-09-22 DIAGNOSIS — F0280 Dementia in other diseases classified elsewhere without behavioral disturbance: Secondary | ICD-10-CM

## 2016-09-22 DIAGNOSIS — R413 Other amnesia: Secondary | ICD-10-CM

## 2016-09-22 DIAGNOSIS — G9341 Metabolic encephalopathy: Secondary | ICD-10-CM | POA: Diagnosis not present

## 2016-09-22 LAB — BASIC METABOLIC PANEL
ANION GAP: 9 (ref 5–15)
BUN: 20 mg/dL (ref 6–20)
CALCIUM: 9.1 mg/dL (ref 8.9–10.3)
CO2: 27 mmol/L (ref 22–32)
Chloride: 104 mmol/L (ref 101–111)
Creatinine, Ser: 0.63 mg/dL (ref 0.61–1.24)
GFR calc Af Amer: >60 mL/min (ref >60)
GFR calc non Af Amer: >60 mL/min (ref >60)
GLUCOSE: 82 mg/dL (ref 65–99)
POTASSIUM: 4.1 mmol/L (ref 3.5–5.1)
Sodium: 140 mmol/L (ref 135–145)

## 2016-09-22 LAB — CBC
HCT: 31.8 % — ABNORMAL LOW (ref 39.0–52.0)
HEMOGLOBIN: 10.2 g/dL — AB (ref 13.0–17.0)
MCH: 25.2 pg — AB (ref 26.0–34.0)
MCHC: 32.1 g/dL (ref 30.0–36.0)
MCV: 78.5 fL (ref 78.0–100.0)
Platelets: 207 10*3/uL (ref 150–400)
RBC: 4.05 MIL/uL — AB (ref 4.22–5.81)
RDW: 14.2 % (ref 11.5–15.5)
WBC: 4.2 10*3/uL (ref 4.0–10.5)

## 2016-09-22 LAB — RPR: RPR Ser Ql: NONREACTIVE

## 2016-09-22 NOTE — Progress Notes (Signed)
Pt discharge education and instructions completed with pt and spouse at bedside. All voices understanding and denies any questions. Pt IV and telemetry removed; pt discharge home with spouse to transport him home. Pt transported off unit via wheelchair with spouse and belongings to the side. P. Amo Keimon Basaldua RN 

## 2016-09-22 NOTE — Progress Notes (Signed)
Called pt spouse, home number and cell#. Home number no option leave VM and left voicemail on cell phone that pt ready and stable for discharge. I left my cell number to call me back with any concerns or questions.  Manson Passeylma Jamil [place] [facility] [phone]

## 2016-09-22 NOTE — Progress Notes (Signed)
  Speech Language Pathology Treatment:    Patient Details Name: Bradley GlassingGary David Fleming MRN: [id number] DOB: 01/12/1946 Today's Date: 09/22/2016 Time: 1415-1430 SLP Time Calculation (min) (ACUTE ONLY): 15 min  Assessment / Plan / Recommendation Clinical Impression  Patient seen in room for dysphagia treatment. Pt upright in recliner with eyes closed, responds verbally to questions, oriented to self, location (Bradley Fleming) but is unable to state hospital or reason for admission. Presented patient with upgraded PO trials to determine appropriateness for upgrade. With tactile cues to assist with grasping spoon, patient is able to feed self teaspoons of pureed texture. Noted with mild oral holding, requires additional time and occasional verbal cues to swallow. Good clearance of bolus. Presented with single bite of regular solids. Patient with adequate however moderately prolonged mastication and min holding prior to swallow initiation. Clears residue with liquid wash with min verbal cue. Pt subsequently self-fed regular solid and noted with multiple bites with bolus holding prior to swallow initiation. Moderate oral residue. Pt with immediate cough following liquid wash of solid. Recommend upgrade to dys 2 with thin liquids with continued full supervision to cue for reduced rate of intake, single bites and sips, and alternating liquids and solids. Meds whole with puree. SLP will f/u for tolerance and potential advancement.    HPI HPI: 71 y.o.maleadmitted with AMS. MRI negative for acute findings. PMH: Parkinson's disease, memory disorder, camptocormia and focal dystonia      SLP Plan  Continue with current plan of care       Recommendations  Diet recommendations: Dysphagia 2 (fine chop);Thin liquid Liquids provided via: Cup Medication Administration: Whole meds with puree Supervision: Full supervision/cueing for compensatory strategies;Patient able to self feed Compensations: Slow rate;Small  sips/bites;Minimize environmental distractions;Follow solids with liquid Postural Changes and/or Swallow Maneuvers: Seated upright 90 degrees                Oral Care Recommendations: Oral care BID Follow up Recommendations: Other (comment) (TBA) SLP Visit Diagnosis: Dysphagia, unspecified (R13.10) Plan: Continue with current plan of care       GO           Functional Assessment Tool Used: skilled clinical judgment Functional Limitations: Swallowing Swallow Current Status ([id number]([id number]): At least 40 percent but less than 60 percent impaired, limited or restricted Swallow Goal Status [phone](G8997): At least 20 percent but less than 40 percent impaired, limited or restricted   Bradley BatonMary Beth Mohid Furuya, MS CF-SLP Speech-Language Pathologist [phone] [name] 09/22/2016, 2:32 PM

## 2016-09-22 NOTE — Progress Notes (Signed)
Occupational Therapy Evaluation Patient Details Name: Bradley Fleming MRN: [id number] DOB: February 10, 1946 Today's Date: 09/22/2016    History of Present Illness Helmut Hennon is a 71 y.o. male with medical history significant of Parkinson's disease, memory disorder, camptocormia and focal dystonia who was delivered to the ED [facility] by EMS after he was found unresponsive this morning.   Clinical Impression   Pt reports that he was independent with assistive devices for ADL and functional mobility prior to admission. However, due to cognitive deficits, unsure of reliability of this report. Pt reports utilizing RW, cane, and wheelchair for functional mobility. He presents with B UE generalized weakness, decreased ability to follow one-step commands, and decreased initiation. He currently requires min guard assist for toilet transfers, supervision and VC's for self-feeding, and mod assist for LB ADL. Feel pt would benefit from home health OT services post-acute D/C to maximize return to PLOF. Will continue to follow acutely.    Follow Up Recommendations  Home health OT    Equipment Recommendations  None recommended by OT    Recommendations for Other Services       Precautions / Restrictions Precautions Precautions: Fall Restrictions Weight Bearing Restrictions: No      Mobility Bed Mobility Overal bed mobility: Needs Assistance Bed Mobility: Supine to Sit     Supine to sit: HOB elevated;Min assist     General bed mobility comments: OOB in chair on OT arrival.  Transfers Overall transfer level: Needs assistance Equipment used: Rolling walker (2 wheeled) Transfers: Sit to/from Stand Sit to Stand: Min guard         General transfer comment: Min guard for safety.    Balance Overall balance assessment: Needs assistance Sitting-balance support: No upper extremity supported;Feet supported Sitting balance-Leahy Scale: Fair     Standing balance support: Bilateral upper  extremity supported Standing balance-Leahy Scale: Fair Standing balance comment: Able to stand at sink to wash his hands without UE support. Reliant on B UE support for functional mobility.                            ADL Overall ADL's : Needs assistance/impaired Eating/Feeding: Supervision/ safety;Set up;Sitting Eating/Feeding Details (indicate cue type and reason): Step-by-step verbal cues to continue with activity. Grooming: Wash/dry hands;Supervision/safety;Standing Grooming Details (indicate cue type and reason): VC's for initiation. Upper Body Bathing: Supervision/ safety;Sitting   Lower Body Bathing: Moderate assistance;Sit to/from stand   Upper Body Dressing : Supervision/safety;Sitting   Lower Body Dressing: Moderate assistance;Sit to/from stand   Toilet Transfer: Min guard;Ambulation;RW Toilet Transfer Details (indicate cue type and reason): Simulated with sit<>stand followed by ambulation. Toileting- Architect and Hygiene: Min guard;Sit to/from stand       Functional mobility during ADLs: Min guard;Rolling walker General ADL Comments: Slow movement during functional mobility and ADL but able to complete with min guard assist and VC's for initiation.     Vision Patient Visual Report: No change from baseline Additional Comments: Able to use peripheral and central vision functionally but will continue to assess. Running into objects on the L      Perception     Praxis      Pertinent Vitals/Pain Pain Assessment: No/denies pain     Hand Dominance Left   Extremity/Trunk Assessment Upper Extremity Assessment Upper Extremity Assessment: LUE deficits/detail;Generalized weakness;RUE deficits/detail RUE Deficits / Details: Holds thumb in adduction. AROM shoulder to 120 degrees.  RUE Coordination:  (in-tact but small movements) LUE Deficits /  Details: Hulds thumb in adduction to palm and digit III in hyperextension. Able to complete composite fist.  AROM shoulder to 120 degrees. LUE Coordination:  (in-tact but small movements)   Lower Extremity Assessment Lower Extremity Assessment: Defer to PT evaluation   Cervical / Trunk Assessment Cervical / Trunk Assessment: Other exceptions Cervical / Trunk Exceptions: Camptocormia   Communication Communication Communication: Other (comment);Expressive difficulties (question word finding deficit)   Cognition Arousal/Alertness: Awake/alert Behavior During Therapy: Flat affect Overall Cognitive Status: No family/caregiver present to determine baseline cognitive functioning                 General Comments: Disoriented to city and name of facility but knew he was in the hospital. Unable to report why he is in the hospital. Thinking it was September. Unable to follow multi-step commands and requires increased time with one-step commands. Decreased initiation of tasks.   General Comments       Exercises       Shoulder Instructions      Home Living Family/patient expects to be discharged to:: Private residence Living Arrangements: Spouse/significant other Available Help at Discharge: Family Type of Home: House Home Access: Level entry     Home Layout: Two level;Bed/bath upstairs Alternate Level Stairs-Number of Steps: unknown       FirefighterBathroom Toilet: Standard     Home Equipment: Environmental consultantWalker - 4 wheels;Cane - single point;Wheelchair - manual;Shower seat;Grab bars - tub/shower          Prior Functioning/Environment Level of Independence: Independent with assistive device(s)        Comments: reports cane in house and walker out of house, but pt not accurate historian with no family available        OT Problem List: Decreased strength;Decreased range of motion;Decreased activity tolerance;Impaired balance (sitting and/or standing);Decreased safety awareness;Decreased knowledge of precautions;Pain;Decreased cognition      OT Treatment/Interventions: Self-care/ADL  training;Energy conservation;DME and/or AE instruction;Therapeutic activities;Cognitive remediation/compensation;Patient/family education;Balance training;Visual/perceptual remediation/compensation;Therapeutic exercise    OT Goals(Current goals can be found in the care plan section) Acute Rehab OT Goals Patient Stated Goal: To return home OT Goal Formulation: With patient Time For Goal Achievement: 10/06/16 Potential to Achieve Goals: Good ADL Goals Pt Will Perform Lower Body Bathing: sit to/from stand;with min guard assist Pt Will Perform Lower Body Dressing: with min guard assist;sit to/from stand Pt Will Transfer to Toilet: with modified independence;ambulating;regular height toilet Pt/caregiver will Perform Home Exercise Program: Increased strength;Both right and left upper extremity;With Supervision Additional ADL Goal #1: Pt will follow 3/4 multi-step commands during ADL tasks with no additional verbal cues in order to improve independence with self-care.  OT Frequency: Min 2X/week   Barriers to D/C:            Co-evaluation              End of Session Equipment Utilized During Treatment: Gait belt;Rolling walker Nurse Communication: Other (comment) (NT - pt not wanting food at this time)  Activity Tolerance: Patient tolerated treatment well Patient left: in chair;with call bell/phone within reach;with chair alarm set  OT Visit Diagnosis: Unsteadiness on feet (R26.81)                ADL either performed or assessed with clinical judgement  Time: 1205-1225 OT Time Calculation (min): 20 min Charges:  OT General Charges $OT Visit: 1 Procedure OT Evaluation $OT Eval Moderate Complexity: 1 Procedure G-Codes:     Doristine Sectionharity A Zeb Rawl, MS OTR/L  Pager: [phone]  Syretta Kochel A Zeven Kocak 09/22/2016, 12:43 PM

## 2016-09-22 NOTE — Discharge Summary (Signed)
Physician Discharge Summary  Ferd GlassingGary David Rosemond ZOX:[id number] DOB: 12/13/1945 DOA: 09/21/2016  PCP: Redmond BasemanWONG,FRANCIS PATRICK, MD  Admit date: 09/21/2016 Discharge date: 09/22/2016  Recommendations for Outpatient Follow-up:  1. No changes in meds on discharge 2. Follow up with PCP in 1 week  Discharge Diagnoses:  Principal Problem:   Encephalopathy acute Active Problems:   Memory disorder   Parkinson disease (HCC)   Dementia due to Parkinson's disease without behavioral disturbance (HCC)    Discharge Condition: stable   Diet recommendation: as tolerated; dysphagia 2  History of present illness:  71 y.o.malewith medical history significant for Parkinson's disease, memory disorder, focal dystonia who [facility] after he was found unresponsive at home. On the morning of the admission, his wife trying to get him up but he looked ill and was not moving. He was recently diagnsoed with orthostatic hypotension in [facility]. Pt was hemodynamically stable. CT head showed no acute abnormalities. Brain MRI showed diffuse small vessel ischemic disease. Pt was seen by neurology in consultation.  Hospital Course:   Principal Problem: Acute metabolic encephalopathy / Parkinson's disease dementia  - Likely toxic- metabolic process those precise etiology not clear unless prat of parkinson's diease as pt already has baseline cognitive issues and likely fairly significant dementia   - MRI brain does not show any structural abnormality to explain pt presentation - Per neuro, focal seizure is a possibility but presentation is very non-specific  - EEG with diffuse slowing - No evidence of acute infectious process  - RPR and B12 normal  - UDS unremarkable - No metabolic derangement on blood work - Continue memantine  - Neurology signed off, no further recommendations   Active Problems:   Parkinson disease (HCC) - Continue sinemet  DVT prophylaxis: Heparin subQ Code Status: full code  Family  Communication: no family at the bedside this am; left VM on pt spouse cell number that pt cleared for discharge and left my cell number to call me back with any concerns or questions Disposition Plan: HH orders placed, home once cleared by neuro    Consultants:   Neurology   PT - HH orders placed   SLP - 2/23 dysphagia 2 puree diet  Procedures:   EEG 2/23 - mild diffuse slowing   Antimicrobials:   None   Signed:  Manson PasseyEVINE, ALMA, MD  [facility] 09/22/2016, 4:47 PM  Pager #: [phone]  Time spent in minutes: less than 30 minutes    Discharge Exam: Vitals:   09/22/16 1002 09/22/16 1339  BP: 122/69 109/69  Pulse: 85 92  Resp: 18 18  Temp: 97.6 F (36.4 C) 97.7 F (36.5 C)   Vitals:   09/22/16 0145 09/22/16 0500 09/22/16 1002 09/22/16 1339  BP: (!) 152/91 (!) 155/97 122/69 109/69  Pulse: 95 88 85 92  Resp: 18 18 18 18   Temp: 97.8 F (36.6 C) 97.5 F (36.4 C) 97.6 F (36.4 C) 97.7 F (36.5 C)  TempSrc: Oral Oral Oral Oral  SpO2: 99% 100% 100% 94%  Weight:        General: Pt is alert, follows commands appropriately, not in acute distress Cardiovascular: Regular rate and rhythm, S1/S2 +, no murmurs Respiratory: Clear to auscultation bilaterally, no wheezing, no crackles, no rhonchi Abdominal: Soft, non tender, non distended, bowel sounds +, no guarding Extremities: no edema, no cyanosis, pulses palpable bilaterally DP and PT Neuro: Grossly nonfocal  Discharge Instructions  Discharge Instructions    Call MD for:  persistant nausea and vomiting  Complete by:  As directed    Call MD for:  redness, tenderness, or signs of infection (pain, swelling, redness, odor or green/yellow discharge around incision site)    Complete by:  As directed    Call MD for:  severe uncontrolled pain    Complete by:  As directed    Diet - low sodium heart healthy    Complete by:  As directed    Increase activity slowly    Complete by:  As directed       Allergies as of 09/22/2016      Reactions   Banana Hives      Medication List    TAKE these medications   ALLERGY EYE DROPS 0.025 % ophthalmic solution Generic drug:  ketotifen Place 1-2 drops into both eyes daily as needed (for allergies).   busPIRone 7.5 MG tablet Commonly known as:  BUSPAR Take 7.5 mg by mouth 2 (two) times daily.   carbidopa-levodopa 50-200 MG tablet Commonly known as:  SINEMET CR Take 1 tablet by mouth 3 (three) times daily.   ENSURE Take 237 mLs by mouth 2 (two) times daily between meals.   gabapentin 300 MG capsule Commonly known as:  NEURONTIN Take 1 capsule (300 mg total) by mouth 3 (three) times daily.   hydrochlorothiazide 25 MG tablet Commonly known as:  HYDRODIURIL Take 1 tablet (25 mg total) by mouth daily. What changed:  when to take this   ibuprofen 200 MG tablet Commonly known as:  ADVIL,MOTRIN Take 400 mg by mouth every 4 (four) hours as needed for fever, headache, mild pain, moderate pain or cramping.   memantine tablet pack Commonly known as:  NAMENDA TITRATION PACK Take by mouth See admin instructions. 5 mg/day for =1 week; 5 mg twice daily for =1 week; 15 mg/day given in 5 mg and 10 mg separated doses for =1 week; then 10 mg twice daily   memantine 10 MG tablet Commonly known as:  NAMENDA Take 10 mg by mouth 2 (two) times daily.   PARoxetine 40 MG tablet Commonly known as:  PAXIL Take 40 mg by mouth daily with breakfast.   selegiline 5 MG tablet Commonly known as:  ELDEPRYL Take 5 mg by mouth 2 (two) times daily.   traZODone 50 MG tablet Commonly known as:  DESYREL Take 50 mg by mouth at bedtime.      Follow-up Information    Redmond Baseman, MD. Schedule an appointment as soon as possible for a visit in 1 week(s).   Specialty:  Family Medicine Contact information: [address] [place] [address] [phone]            The results of significant diagnostics from this hospitalization  (including imaging, microbiology, ancillary and laboratory) are listed below for reference.    Significant Diagnostic Studies: Mr Brain 68 Contrast  Result Date: 09/21/2016 CLINICAL DATA:  Acute onset of speech disturbance in generalized weakness several hours ago. EXAM: MRI HEAD WITHOUT CONTRAST TECHNIQUE: Multiplanar, multiecho pulse sequences of the brain and surrounding structures were obtained without intravenous contrast. COMPARISON:  Earlier same day FINDINGS: Brain: Diffusion imaging does not show any acute or subacute infarction. There chronic small-vessel ischemic changes affecting the pons. Small-vessel changes also noted affecting the thalami and basal ganglia. Cerebral hemispheres show chronic small-vessel ischemic changes affecting the deep and subcortical white matter. No large vessel territory infarction. No mass lesion, hemorrhage, hydrocephalus or extra-axial collection. No pituitary mass. Vascular: Major vessels at the base of the brain  show flow. Skull and upper cervical spine: Negative Sinuses/Orbits: Clear/normal Other: None significant IMPRESSION: No acute finding. Chronic small-vessel ischemic changes affecting the brainstem, thalami, basal ganglia and cerebral hemispheric white matter. Electronically Signed   By: Paulina Fusi M.D.   On: 09/21/2016 08:17   Ct Head Code Stroke W/o Cm  Result Date: 09/21/2016 CLINICAL DATA:  Code stroke.  Bilateral arm weakness. EXAM: CT HEAD WITHOUT CONTRAST TECHNIQUE: Contiguous axial images were obtained from the base of the skull through the vertex without intravenous contrast. COMPARISON:  None. FINDINGS: Brain: No evidence of acute infarction, hemorrhage, hydrocephalus, extra-axial collection or mass lesion/mass effect. There is periventricular hypoattenuation compatible with chronic microvascular disease. There is diffuse atrophy, greater than expected for age. Vascular: No hyperdense vessel or unexpected calcification. Skull: Normal. Negative  for fracture or focal lesion. Sinuses/Orbits: The visualized portions of the paranasal sinuses and mastoid air cells are free of fluid. No advanced mucosal thickening. The visualized orbits are normal. Other: None ASPECTS (Alberta Stroke Program Early CT Score) - Ganglionic level infarction (caudate, lentiform nuclei, internal capsule, insula, M1-M3 cortex): 7 - Supraganglionic infarction (M4-M6 cortex): 3 Total score (0-10 with 10 being normal): 10 IMPRESSION: 1. No acute intracranial abnormality. 2. Chronic microvascular ischemia and age advanced cerebral atrophy. 3. ASPECTS is 10. These results were called by telephone at the time of interpretation on 09/21/2016 at 5:14 am to Dr. Noel Christmas , who verbally acknowledged these results. Electronically Signed   By: Deatra Robinson M.D.   On: 09/21/2016 05:14    Microbiology: No results found for this or any previous visit (from the past 240 hour(s)).   Labs: Basic Metabolic Panel:  Recent Labs Lab 09/21/16 0450 09/21/16 0454 09/21/16 1055 09/22/16 0315  NA 138 140  --  140  K 4.3 4.2  --  4.1  CL 101 98*  --  104  CO2 29  --   --  27  GLUCOSE 86 86  --  82  BUN 20 25*  --  20  CREATININE 0.78 0.80 0.75 0.63  CALCIUM 9.5  --   --  9.1   Liver Function Tests:  Recent Labs Lab 09/21/16 0450  AST 25  ALT 37  ALKPHOS 88  BILITOT 0.7  PROT 6.7  ALBUMIN 3.6   No results for input(s): LIPASE, AMYLASE in the last 168 hours. No results for input(s): AMMONIA in the last 168 hours. CBC:  Recent Labs Lab 09/21/16 0450 09/21/16 0454 09/21/16 1055 09/22/16 0315  WBC 3.9*  --  3.8* 4.2  NEUTROABS 1.8  --   --   --   HGB 11.4* 12.2* 11.2* 10.2*  HCT 34.8* 36.0* 33.7* 31.8*  MCV 78.9  --  78.0 78.5  PLT 191  --  179 207   Cardiac Enzymes: No results for input(s): CKTOTAL, CKMB, CKMBINDEX, TROPONINI in the last 168 hours. BNP: BNP (last 3 results) No results for input(s): BNP in the last 8760 hours.  ProBNP (last 3  results) No results for input(s): PROBNP in the last 8760 hours.  CBG: No results for input(s): GLUCAP in the last 168 hours.

## 2016-09-22 NOTE — Progress Notes (Signed)
Neurology Progress Note  Subjective: He reports that he is feeling better today. He is unable to provide much information about the events that led up to his admission. He states that he slept well last night. 12-point ROS is negative with the exception of some mild muscle pain in his neck and shoulders and chronic weakness in his legs (he reports that he uses a walker and a wheelchair at baseline.)  Current Meds:   Current Facility-Administered Medications:  .  acetaminophen (TYLENOL) tablet 650 mg, 650 mg, Oral, Q6H PRN **OR** acetaminophen (TYLENOL) suppository 650 mg, 650 mg, Rectal, Q6H PRN, Isaiah BlakesMarina S Kyazimova, PA-C .  carbidopa-levodopa (SINEMET CR) 50-200 MG per tablet controlled release 1 tablet, 1 tablet, Oral, TID, Isaiah BlakesMarina S Kyazimova, PA-C, 1 tablet at 09/21/16 2203 .  gabapentin (NEURONTIN) capsule 300 mg, 300 mg, Oral, TID, Isaiah BlakesMarina S Kyazimova, PA-C, 300 mg at 09/21/16 2202 .  heparin injection 5,000 Units, 5,000 Units, Subcutaneous, Q8H, Isaiah BlakesMarina S Kyazimova, PA-C, 5,000 Units at 09/22/16 [id number] .  memantine (NAMENDA) tablet 10 mg, 10 mg, Oral, BID, Isaiah BlakesMarina S Kyazimova, PA-C, 10 mg at 09/21/16 2202 .  ondansetron (ZOFRAN) tablet 4 mg, 4 mg, Oral, Q6H PRN **OR** ondansetron (ZOFRAN) injection 4 mg, 4 mg, Intravenous, Q6H PRN, Isaiah BlakesMarina S Kyazimova, PA-C .  polyethylene glycol (MIRALAX / GLYCOLAX) packet 17 g, 17 g, Oral, Daily PRN, Isaiah BlakesMarina S Kyazimova, PA-C .  traZODone (DESYREL) tablet 25 mg, 25 mg, Oral, QHS PRN, Isaiah BlakesMarina S Kyazimova, PA-C  Objective:  Temp:  [97.5 F (36.4 C)-98.2 F (36.8 C)] 97.5 F (36.4 C) (02/24 0500) Pulse Rate:  [86-100] 88 (02/24 0500) Resp:  [16-18] 18 (02/24 0500) BP: (127-155)/(62-97) 155/97 (02/24 0500) SpO2:  [99 %-100 %] 100 % (02/24 0500)  General:Thin man sitting up in bed eating breakfast. He is alert. He is oriented to self only. He knows he is in the hospital but cannot name it and [place] He has slight  dysarthria. There is no aphasia. Affect is flat. HEENT: Neck is supple without lymphadenopathy. He has some muscle tightness in the posterior neck muscles. Mucous membranes are moist and the oropharynx is clear. Sclerae are anicteric. There is no conjunctival injection.  CV: Regular, no murmur. Carotid pulses are 2+ and symmetric with no bruits. Distal pulses 2+ and symmetric.  Lungs: CTAB  Extremities: No C/C/E. Neuro: MS: As noted above. No aphasia.  CN: Pupils are equal and reactive from 3-->2 mm bilaterally. EOMI, no nystagmus. He has breakup of smooth pursuits in all directions of gaze. Facial sensation is intact to light touch. Face is symmetric at rest with normal strength. His face is slightly masked at rest. Hearing is intact to conversational voice. Voice is normal in tone and quality. Palate elevates symmetrically. Uvula is midline. Bilateral SCM and trapezii are 4/5. Tongue is midline with normal bulk and mobility.  Motor: Bulk is diminished diffusely. He has mildly increased tone diffusely. Strength is 5/5 in the arms, 4/5 in the proximal LEs, 4+/5 in the distal LEs. No pronator drift. No tremor or other abnormal movements are observed.  Sensation: Intact to light touch.  DTRs: 2+, symmetric with the exception of decreased ankle jerks bilaterally. Toes are downgoing bilaterally. No pathological reflexes.  Coordination: Finger-to-nose is slow but no dysmetria. Finger taps are slow with mild decrement.   Labs: Lab Results  Component Value Date   WBC 4.2 09/22/2016   HGB 10.2 (L) 09/22/2016   HCT 31.8 (  L) 09/22/2016   PLT 207 09/22/2016   GLUCOSE 82 09/22/2016   ALT 37 09/21/2016   AST 25 09/21/2016   NA 140 09/22/2016   K 4.1 09/22/2016   CL 104 09/22/2016   CREATININE 0.63 09/22/2016   BUN 20 09/22/2016   CO2 27 09/22/2016   TSH 1.756 09/21/2016   INR 0.93 09/21/2016   CBC Latest Ref Rng & Units 09/22/2016 09/21/2016 09/21/2016  WBC 4.0 - 10.5 K/uL 4.2 3.8(L) -  Hemoglobin  13.0 - 17.0 g/dL 10.2(L) 11.2(L) 12.2(L)  Hematocrit 39.0 - 52.0 % 31.8(L) 33.7(L) 36.0(L)  Platelets 150 - 400 K/uL 207 179 -    No results found for: HGBA1C Lab Results  Component Value Date   ALT 37 09/21/2016   AST 25 09/21/2016   ALKPHOS 88 09/21/2016   BILITOT 0.7 09/21/2016   RPR nonreactive B12 1448 TSH 1.756 UA negative Urine drug screen negative   Radiology:  I have personally and independently reviewed the MRI of the brain without contrast. This shows moderate diffuse generalized atrophy. There is a moderate burden of chronic small vessel ischemic change involving the bihemispheric white matter and the pons. No obvious acute abnormality is noted.   Other diagnostic studies:  EEG showed mild diffuse generalized slowing, no focal abnormality and no epileptiform discharges.   A/P:   1. Acute encephalopathy: This is most likely a toxic-metabolic process, the precise etiology of which remains unclear. He appears to have resolved. MRI brain does not show any structural abnormality to explain his presentation. Focal seizure is possible but presentation is very non-specific in this case so cannot definitively say seizure occurred. His underlying cerebrovascular disease could increase his risk for seizure, however. Labs thus far have not shown any obvious metabolic derangement. He has not shown any evidence of infection. He has some reported cognitive issues at baseline and I suspect he has a fairly significant dementia. This would place him at increased risk of encephalopathy from all causes. Continue to optimize metabolic status as you are. Minimize the use of opiates, benzos or any medication with strong anticholinergic properties as much as possible. Optimize sleep-wake cycles as much as you can by keeping the room bright with activity during the day and dark and quiet at night. For agitation, recommend low-dose haloperidol or an atypical antipsychotic.   2. Parkinson's disease:  Symptoms well-controlled on current exam. Continue outpatient regimen.   3. BLE weakness: He has chronic weakness in both legs and states that he is dependent on walker or wheelchair at all times. No obvious acute issues. Follow.   4. Abnormality of gait: This is chronic. As above, he reports longstanding BLE weakness and has required assistive devices for some time. His Parkinson's may also affect gait. No acute issues. Follow.   No family present at the bedside.   No additional recommendations at this time, will sign off. Call if any new issues arise.   Rhona Leavens, MD [facility]

## 2016-09-22 NOTE — Care Management Note (Addendum)
Case Management Note  Patient Details  Name: Bradley GlassingGary David Fleming MRN: [id number] Date of Birth: 12/09/1945  Subjective/Objective:                 Spoke with patient, wife not in room. [facility]. Awaiting MD orders. [facility] clinical liaison [facility]. No DME needs noted at this time.    Action/Plan:  Addendum 16:00 Spoke with wife, made aware of plan for discharge today. She is at bedside and will provide transportation home Expected Discharge Date:                  Expected Discharge Plan:  Home w Home Health Services  In-House Referral:     Discharge planning Services  CM Consult  Post Acute Care Choice:  Home Health Choice offered to:  Patient  DME Arranged:    DME Agency:     HH Arranged:  PT OT RN [facility] HH Agency:  [facility]  Status of Service:  In process, will continue to follow  If discussed at Long Length of Stay Meetings, dates discussed:    Additional Comments:  Lawerance SabalDebbie Tyge Somers, RN 09/22/2016, 12:25 PM

## 2016-09-22 NOTE — Discharge Instructions (Signed)
Toxic Metabolic Encephalopathy °Toxic metabolic encephalopathy (TME) is a type of brain disorder caused by a change in brain chemistry. This condition may result from illnesses or conditions that cause an imbalance of fluid, minerals (electrolytes), and other substances in the body that affect the way the brain functions. It is not caused by brain damage or brain disease. °TME can cause confusion and other mental disturbances, which are generally referred to as delirium. Untreated delirium may lead to permanent mental changes or worsening medical conditions. Untreated delirium is a life-threatening condition that may need to be treated in the hospital. °What are the causes? °Possible causes of TME that can lead to delirium include: °· Short-term (acute) or long-term (chronic) disease of the kidney or liver. °· Not having enough fluid in the body (dehydration). °· Changes in the acid level (pH) of the blood. °· High or low levels of any of the following substances in the blood: °¨ Calcium. °¨ Salt (sodium). °¨ Sugar (glucose). °¨ Magnesium. °¨ Phosphate. °· High body temperature. °· Not having enough oxygen in the blood. °· Low levels of B vitamins. This can result from alcohol abuse. °· Certain medicines, such as steroids and medicines that reduce the activity of the immune system (immunosuppressants). °· Certain infections. °What increases the risk? °You may have a higher risk for TME if you: °· Are elderly. °· Have dementia. °· Are in the hospital, especially in intensive care. °· Live in a nursing home. °· Had recent surgery. °· Have liver or kidney disease. °· Have poorly controlled diabetes. °· Have chronic medical problems, especially heart or lung disease. °· Are not getting enough fluids. °· Have poor nutrition. °· Abuse alcohol. °What are the signs or symptoms? °Symptoms of TME may include: °· Muscle stiffness or jerking (spasticity). °· Shaking (tremors). °· Flapping of the  hands. °· Weakness. °· Clumsiness. °· Slowed breathing. °· Jerky movements that you cannot control (seizures). °· Not being able to stay awake (drowsiness). °· Not being able to pay attention. °· Loss of consciousness (coma). °Symptoms of delirium caused by TME include: °· Confusion. °· Difficulty focusing or concentrating, or inability to focus or concentrate. °· Not knowing where you are (disorientation). °· Seeing or hearing things that are not real (hallucinations). °· Fearfulness. °· False beliefs (delusions). °· Changes in mood or personality. °· Changes in speech, such as saying things that do not make sense. °· Memory loss. °· Irritability. °· Avoiding other people (withdrawal). °· Depression. °· Poor judgment. °· Changes in eating and sleeping patterns. °· Hyperactivity. °· Decreased alertness. °· General mistrust of others (paranoia). °Delirium may come and go. Symptoms of delirium may start suddenly or gradually, and they often get worse at night. °How is this diagnosed? °This condition is diagnosed based on: °· Your symptoms and behavior. °· An exam to check how you are thinking, feeling, and behaving (mental status exam). To diagnose delirium, the mental status exam must rule out other possible causes of TME, and must show: °¨ Changes in attention and awareness. °¨ Changes that develop over a short period of time and tend to come and go (fluctuate). °¨ Changes in memory, language, and thinking that were not present before. °· A physical exam. °· Imaging tests, such as: °¨ MRI. °¨ CT scan. °· Blood tests to: °¨ Measure liver and kidney function. °¨ Check for a lack (deficiency) of vitamin B. °¨ Check for changes in acid levels (pH) and changes in calcium, sodium, or magnesium levels in the blood. °¨   Measure your blood sugar (glucose). °¨ Measure your blood oxygen level. °How is this treated? °Treatment for TME depends on the cause, and it may include. °· Getting fluids through an IV tube. °· Regulating  calcium, sodium, glucose, or magnesium levels in the body. °· Getting oxygen. °· Improving nutrition. °· Treating liver or kidney disease. °· Adjusting certain medicines. °· Treating infections. °If the cause is found and treated, delirium usually improves. Managing delirium may include: °· Keeping the room well-lit and quiet. °· Using calendars, pictures, and clocks to prevent disorientation. °· Having frequent checks from nursing staff and visits from caregivers. °· Wearing eyeglasses or a hearing aid, if needed. °· Physical therapy. °· Medicine to treat agitation, anxiety, hallucinations, or delusions. °Follow these instructions at home: °· Drink enough fluid to keep your urine clear or pale yellow. °· Take over-the-counter and prescription medicines only as told by your health care provider. °· Return to your normal activities as told by your health care provider. Ask your health care provider what activities are safe for you. °· Follow a healthy diet. Do not skip meals. °· Do not drink alcohol. °· Go to bed at the same time every night. °· Keep all follow-up visits as told by your health care provider. This is important. °Contact a health care provider if: °· You are unable to feed yourself or hydrate yourself. °· You need help at home. °· You start to feel clumsy. °· You start to have tremors or weakness. °Get help right away if: °· You have a seizure. °· You lose consciousness. °· You have trouble breathing. °· You do not feel able to care for yourself at home. °· You have a fever. °· You become disoriented at home. °This information is not intended to replace advice given to you by your health care provider. Make sure you discuss any questions you have with your health care provider. °Document Released: 12/23/2015 Document Revised: 03/19/2016 Document Reviewed: 12/23/2015 °[employer] Interactive Patient Education © 2017 [employer] ° °

## 2016-09-22 NOTE — Evaluation (Signed)
Physical Therapy Evaluation Patient Details Name: Bradley Fleming MRN: [id number] DOB: [birth year] Today's Date: 09/22/2016   History of Present Illness  Bradley Fleming is a 71 y.o. male with medical history significant of Parkinson's disease, memory disorder, camptocormia and focal dystonia who was delivered to the ED [facility] by EMS after he was found unresponsive this morning.  Clinical Impression  Patient presents with decreased independence with mobility due to deficits listed in PT problem list.  He will benefit from skilled PT in the acute setting to allow return home with family support and follow up HHPT.     Follow Up Recommendations Home health PT;Supervision/Assistance - 24 hour    Equipment Recommendations  None recommended by PT    Recommendations for Other Services       Precautions / Restrictions Precautions Precautions: Fall      Mobility  Bed Mobility Overal bed mobility: Needs Assistance Bed Mobility: Supine to Sit     Supine to sit: HOB elevated;Min assist     General bed mobility comments: with rail, increased time, assist for initial balance  Transfers Overall transfer level: Needs assistance Equipment used: Rolling walker (2 wheeled) Transfers: Sit to/from Stand Sit to Stand: Min guard         General transfer comment: for balance  Ambulation/Gait Ambulation/Gait assistance: Supervision;Min guard Ambulation Distance (Feet): 80 Feet Assistive device: Rolling walker (2 wheeled) Gait Pattern/deviations: Step-to pattern;Trunk flexed;Decreased stride length;Shuffle;Narrow base of support     General Gait Details: assist intermittent to turn walker, but mostly S  Stairs            Wheelchair Mobility    Modified Rankin (Stroke Patients Only)       Balance Overall balance assessment: Needs assistance   Sitting balance-Leahy Scale: Fair     Standing balance support: Bilateral upper extremity supported Standing balance-Leahy Scale:  Poor Standing balance comment: initially leaning back with back of legs braced against the bed, with UE support on walker, improved with more anterior weight shift                              Pertinent Vitals/Pain Pain Assessment: No/denies pain    Home Living Family/patient expects to be discharged to:: Private residence Living Arrangements: Spouse/significant other Available Help at Discharge: Family Type of Home: House Home Access: Level entry     Home Layout: Two level;Bed/bath upstairs Home Equipment: Walker - 4 wheels;Cane - single point      Prior Function Level of Independence: Independent with assistive device(s)         Comments: reports cane in house and walker out of house, but pt not accurate historian with no family available     Hand Dominance        Extremity/Trunk Assessment   Upper Extremity Assessment Upper Extremity Assessment: Defer to OT evaluation    Lower Extremity Assessment Lower Extremity Assessment: Generalized weakness (and some rigidity)    Cervical / Trunk Assessment Cervical / Trunk Assessment: Other exceptions Cervical / Trunk Exceptions: Camptocormia  Communication   Communication: Other (comment);Expressive difficulties (question work finding deficit)  Cognition Arousal/Alertness: Awake/alert Behavior During Therapy: Flat affect Overall Cognitive Status: No family/caregiver present to determine baseline cognitive functioning                 General Comments: disoriented to city, unable to say "[facility]", but seemed to know he was in hospital, but not sure why  General Comments      Exercises     Assessment/Plan    PT Assessment Patient needs continued PT services  PT Problem List Decreased strength;Decreased mobility;Decreased coordination;Decreased balance;Decreased knowledge of use of DME;Decreased safety awareness       PT Treatment Interventions DME instruction;Gait training;Therapeutic  exercise;Stair training;Balance training;Functional mobility training;Therapeutic activities;Patient/family education    PT Goals (Current goals can be found in the Care Plan section)  Acute Rehab PT Goals Patient Stated Goal: To return home PT Goal Formulation: With patient Time For Goal Achievement: 09/26/16 Potential to Achieve Goals: Good    Frequency Min 3X/week   Barriers to discharge        Co-evaluation               End of Session Equipment Utilized During Treatment: Gait belt Activity Tolerance: Patient tolerated treatment well Patient left: in chair;with call bell/phone within reach;with chair alarm set   PT Visit Diagnosis: Other abnormalities of gait and mobility (R26.89)    Functional Assessment Tool Used: AM-PAC 6 Clicks Basic Mobility Functional Limitation: Mobility: Walking and moving around Mobility: Walking and Moving Around Current Status (Z6109(G8978): At least 40 percent but less than 60 percent impaired, limited or restricted Mobility: Walking and Moving Around Goal Status [phone]([phone]): At least 20 percent but less than 40 percent impaired, limited or restricted    Time: 1052-1117 PT Time Calculation (min) (ACUTE ONLY): 25 min   Charges:   PT Evaluation $PT Eval Moderate Complexity: 1 Procedure PT Treatments $Gait Training: 8-22 mins   PT G Codes:   PT G-Codes **NOT FOR INPATIENT CLASS** Functional Assessment Tool Used: AM-PAC 6 Clicks Basic Mobility Functional Limitation: Mobility: Walking and moving around Mobility: Walking and Moving Around Current Status (U9811(G8978): At least 40 percent but less than 60 percent impaired, limited or restricted Mobility: Walking and Moving Around Goal Status [phone](G8979): At least 20 percent but less than 40 percent impaired, limited or restricted     Bradley McgregorCynthia Ziare Fleming 09/22/2016, 12:20 PM  Bradley Fleming, [place]PT [phone] 09/22/2016

## 2016-09-22 NOTE — Progress Notes (Addendum)
Patient ID: Bradley Fleming, male   DOB: [birth year], 71 y.o.   MRN: [id number]  PROGRESS NOTE    Bradley Fleming  JXB:[id number] DOB: 03-29-1946 DOA: 09/21/2016  PCP: Redmond Baseman, MD   Brief Narrative:  71 y.o. male with medical history significant for Parkinson's disease, memory disorder, focal dystonia who presented to [facility] after he was found unresponsive at home. On the morning of the admission, his wife trying to get him up but he looked ill and was not moving. He was recently diagnsoed with orthostatic hypotension in [facility]. Pt was hemodynamically stable. CT head showed no acute abnormalities. Brain MRI showed diffuse small vessel ischemic disease. Pt was seen by neurology in consultation.  Assessment & Plan:   Principal Problem: Acute metabolic encephalopathy / Parkinson's disease dementia  - Likely toxic- metabolic process those precise etiology not clear unless prat of parkinson's diease as pt already has baseline cognitive issues and likely fairly significant dementia   - MRI brain does not show any structural abnormality to explain pt presentation - Per neuro, focal seizure is a possibility but presentation is very non-specific  - EEG with diffuse slowing - No evidence of acute infectious process  - RPR and B12 normal  - UDS unremarkable - No metabolic derangement on blood work - Continue memantine  - Continue supportive care   Active Problems:   Parkinson disease (HCC) - Continue sinemet  DVT prophylaxis: Heparin subQ Code Status: full code  Family Communication: no family at the bedside this am Disposition Plan: HH orders placed, home once cleared by neuro    Consultants:   Neurology   PT - HH orders placed   SLP - 2/23 dysphagia 1 puree diet  Procedures:   EEG 2/23 - mild diffuse slowing   Antimicrobials:   None   Subjective: No overnight events.  Objective: Vitals:   09/22/16 0145 09/22/16 0500 09/22/16 1002 09/22/16 1339  BP: (!)  152/91 (!) 155/97 122/69 109/69  Pulse: 95 88 85 92  Resp: 18 18 18 18   Temp: 97.8 F (36.6 C) 97.5 F (36.4 C) 97.6 F (36.4 C) 97.7 F (36.5 C)  TempSrc: Oral Oral Oral Oral  SpO2: 99% 100% 100% 94%  Weight:        Intake/Output Summary (Last 24 hours) at 09/22/16 1422 Last data filed at 09/21/16 1900  Gross per 24 hour  Intake              240 ml  Output              400 ml  Net             -160 ml   Filed Weights   09/21/16 0510  Weight: 55.8 kg (123 lb 0.3 oz)    Examination:  General exam: Appears calm and comfortable  Respiratory system: Clear to auscultation. Respiratory effort normal. Cardiovascular system: S1 & S2 heard, Rate controlled Gastrointestinal system: Abdomen is nondistended, soft and nontender. No organomegaly or masses felt. Normal bowel sounds heard. Central nervous system: Does not answer all questions, oriented to self only  Extremities: Symmetric 5 x 5 power. Skin: No rashes, lesions or ulcers Psychiatry: Normal mood and behavior   Data Reviewed: I have personally reviewed following labs and imaging studies  CBC:  Recent Labs Lab 09/21/16 0450 09/21/16 0454 09/21/16 1055 09/22/16 0315  WBC 3.9*  --  3.8* 4.2  NEUTROABS 1.8  --   --   --   HGB 11.4*  12.2* 11.2* 10.2*  HCT 34.8* 36.0* 33.7* 31.8*  MCV 78.9  --  78.0 78.5  PLT 191  --  179 207   Basic Metabolic Panel:  Recent Labs Lab 09/21/16 0450 09/21/16 0454 09/21/16 1055 09/22/16 0315  NA 138 140  --  140  K 4.3 4.2  --  4.1  CL 101 98*  --  104  CO2 29  --   --  27  GLUCOSE 86 86  --  82  BUN 20 25*  --  20  CREATININE 0.78 0.80 0.75 0.63  CALCIUM 9.5  --   --  9.1   GFR: Estimated Creatinine Clearance: 69.8 mL/min (by C-G formula based on SCr of 0.63 mg/dL). Liver Function Tests:  Recent Labs Lab 09/21/16 0450  AST 25  ALT 37  ALKPHOS 88  BILITOT 0.7  PROT 6.7  ALBUMIN 3.6   No results for input(s): LIPASE, AMYLASE in the last 168 hours. No results for  input(s): AMMONIA in the last 168 hours. Coagulation Profile:  Recent Labs Lab 09/21/16 0450  INR 0.93   Cardiac Enzymes: No results for input(s): CKTOTAL, CKMB, CKMBINDEX, TROPONINI in the last 168 hours. BNP (last 3 results) No results for input(s): PROBNP in the last 8760 hours. HbA1C: No results for input(s): HGBA1C in the last 72 hours. CBG: No results for input(s): GLUCAP in the last 168 hours. Lipid Profile: No results for input(s): CHOL, HDL, LDLCALC, TRIG, CHOLHDL, LDLDIRECT in the last 72 hours. Thyroid Function Tests:  Recent Labs  09/21/16 0731  TSH 1.756   Anemia Panel:  Recent Labs  09/21/16 0731 09/21/16 1055  VITAMINB12  --  1,448*  FOLATE 15.7  --    Urine analysis:    Component Value Date/Time   COLORURINE YELLOW 09/21/2016 1435   APPEARANCEUR CLEAR 09/21/2016 1435   LABSPEC 1.015 09/21/2016 1435   PHURINE 6.0 09/21/2016 1435   GLUCOSEU NEGATIVE 09/21/2016 1435   HGBUR MODERATE (A) 09/21/2016 1435   BILIRUBINUR NEGATIVE 09/21/2016 1435   KETONESUR NEGATIVE 09/21/2016 1435   PROTEINUR NEGATIVE 09/21/2016 1435   NITRITE NEGATIVE 09/21/2016 1435   LEUKOCYTESUR NEGATIVE 09/21/2016 1435   Sepsis Labs: @LABRCNTIP (procalcitonin:4,lacticidven:4)   )No results found for this or any previous visit (from the past 240 hour(s)).    Radiology Studies: Mr Brain Wo Contrast Result Date: 09/21/2016 No acute finding. Chronic small-vessel ischemic changes affecting the brainstem, thalami, basal ganglia and cerebral hemispheric white matter.   Ct Head Code Stroke W/o Cm  1. No acute intracranial abnormality. 2. Chronic microvascular ischemia and age advanced cerebral atrophy.    Scheduled Meds: . carbidopa-levodopa  1 tablet Oral TID  . gabapentin  300 mg Oral TID  . heparin  5,000 Units Subcutaneous Q8H  . memantine  10 mg Oral BID   Continuous Infusions:   LOS: 0 days    Time spent: 25 minutes  Greater than 50% of the time spent on  counseling and coordinating the care.   Manson PasseyEVINE, Kody Brandl, MD [facility] Pager [phone]  If 7PM-7AM, please contact night-coverage [email or web address] Password [email or web address] 09/22/2016, 2:22 PM

## 2016-09-23 DIAGNOSIS — I10 Essential (primary) hypertension: Secondary | ICD-10-CM | POA: Diagnosis not present

## 2016-09-24 DIAGNOSIS — G20 Parkinson's disease: Secondary | ICD-10-CM | POA: Diagnosis not present

## 2016-09-24 DIAGNOSIS — F0280 Dementia in other diseases classified elsewhere without behavioral disturbance: Secondary | ICD-10-CM | POA: Diagnosis not present

## 2016-09-24 DIAGNOSIS — F329 Major depressive disorder, single episode, unspecified: Secondary | ICD-10-CM | POA: Diagnosis not present

## 2016-09-24 DIAGNOSIS — I951 Orthostatic hypotension: Secondary | ICD-10-CM | POA: Diagnosis not present

## 2016-09-24 DIAGNOSIS — R413 Other amnesia: Secondary | ICD-10-CM | POA: Diagnosis not present

## 2016-09-24 DIAGNOSIS — G9341 Metabolic encephalopathy: Secondary | ICD-10-CM | POA: Diagnosis not present

## 2016-09-24 LAB — FOLATE RBC
FOLATE, RBC: 1407 ng/mL (ref >498)
Folate, Hemolysate: 491 ng/mL
HEMATOCRIT: 34.9 % — AB (ref 37.5–51.0)

## 2016-09-25 DIAGNOSIS — G20 Parkinson's disease: Secondary | ICD-10-CM | POA: Diagnosis not present

## 2016-09-25 DIAGNOSIS — M5137 Other intervertebral disc degeneration, lumbosacral region: Secondary | ICD-10-CM | POA: Diagnosis not present

## 2016-09-25 DIAGNOSIS — G9340 Encephalopathy, unspecified: Secondary | ICD-10-CM | POA: Diagnosis not present

## 2016-09-25 DIAGNOSIS — M9903 Segmental and somatic dysfunction of lumbar region: Secondary | ICD-10-CM | POA: Diagnosis not present

## 2016-09-25 DIAGNOSIS — M791 Myalgia: Secondary | ICD-10-CM | POA: Diagnosis not present

## 2016-09-25 DIAGNOSIS — M545 Low back pain: Secondary | ICD-10-CM | POA: Diagnosis not present

## 2016-09-25 DIAGNOSIS — M9901 Segmental and somatic dysfunction of cervical region: Secondary | ICD-10-CM | POA: Diagnosis not present

## 2016-09-25 DIAGNOSIS — M6240 Contracture of muscle, unspecified site: Secondary | ICD-10-CM | POA: Diagnosis not present

## 2016-09-25 DIAGNOSIS — Z09 Encounter for follow-up examination after completed treatment for conditions other than malignant neoplasm: Secondary | ICD-10-CM | POA: Diagnosis not present

## 2016-09-25 DIAGNOSIS — I10 Essential (primary) hypertension: Secondary | ICD-10-CM | POA: Diagnosis not present

## 2016-09-26 DIAGNOSIS — G20 Parkinson's disease: Secondary | ICD-10-CM | POA: Diagnosis not present

## 2016-09-26 DIAGNOSIS — I951 Orthostatic hypotension: Secondary | ICD-10-CM | POA: Diagnosis not present

## 2016-09-26 DIAGNOSIS — F0280 Dementia in other diseases classified elsewhere without behavioral disturbance: Secondary | ICD-10-CM | POA: Diagnosis not present

## 2016-09-26 DIAGNOSIS — F329 Major depressive disorder, single episode, unspecified: Secondary | ICD-10-CM | POA: Diagnosis not present

## 2016-09-26 DIAGNOSIS — G9341 Metabolic encephalopathy: Secondary | ICD-10-CM | POA: Diagnosis not present

## 2016-09-26 DIAGNOSIS — R413 Other amnesia: Secondary | ICD-10-CM | POA: Diagnosis not present

## 2016-09-27 DIAGNOSIS — R413 Other amnesia: Secondary | ICD-10-CM | POA: Diagnosis not present

## 2016-09-27 DIAGNOSIS — M6240 Contracture of muscle, unspecified site: Secondary | ICD-10-CM | POA: Diagnosis not present

## 2016-09-27 DIAGNOSIS — F0280 Dementia in other diseases classified elsewhere without behavioral disturbance: Secondary | ICD-10-CM | POA: Diagnosis not present

## 2016-09-27 DIAGNOSIS — G9341 Metabolic encephalopathy: Secondary | ICD-10-CM | POA: Diagnosis not present

## 2016-09-27 DIAGNOSIS — M545 Low back pain: Secondary | ICD-10-CM | POA: Diagnosis not present

## 2016-09-27 DIAGNOSIS — F329 Major depressive disorder, single episode, unspecified: Secondary | ICD-10-CM | POA: Diagnosis not present

## 2016-09-27 DIAGNOSIS — M791 Myalgia: Secondary | ICD-10-CM | POA: Diagnosis not present

## 2016-09-27 DIAGNOSIS — M9903 Segmental and somatic dysfunction of lumbar region: Secondary | ICD-10-CM | POA: Diagnosis not present

## 2016-09-27 DIAGNOSIS — M9901 Segmental and somatic dysfunction of cervical region: Secondary | ICD-10-CM | POA: Diagnosis not present

## 2016-09-27 DIAGNOSIS — M5137 Other intervertebral disc degeneration, lumbosacral region: Secondary | ICD-10-CM | POA: Diagnosis not present

## 2016-09-27 DIAGNOSIS — I951 Orthostatic hypotension: Secondary | ICD-10-CM | POA: Diagnosis not present

## 2016-09-27 DIAGNOSIS — G20 Parkinson's disease: Secondary | ICD-10-CM | POA: Diagnosis not present

## 2016-09-28 DIAGNOSIS — G9341 Metabolic encephalopathy: Secondary | ICD-10-CM | POA: Diagnosis not present

## 2016-09-28 DIAGNOSIS — I951 Orthostatic hypotension: Secondary | ICD-10-CM | POA: Diagnosis not present

## 2016-09-28 DIAGNOSIS — G20 Parkinson's disease: Secondary | ICD-10-CM | POA: Diagnosis not present

## 2016-09-28 DIAGNOSIS — F329 Major depressive disorder, single episode, unspecified: Secondary | ICD-10-CM | POA: Diagnosis not present

## 2016-09-28 DIAGNOSIS — R413 Other amnesia: Secondary | ICD-10-CM | POA: Diagnosis not present

## 2016-09-28 DIAGNOSIS — F0280 Dementia in other diseases classified elsewhere without behavioral disturbance: Secondary | ICD-10-CM | POA: Diagnosis not present

## 2016-09-29 NOTE — Progress Notes (Addendum)
Late entry for missed G-code for OT evaluation 09/22/16.    09/22/16 1219  OT G-codes **NOT FOR INPATIENT CLASS**  Functional Assessment Tool Used Clinical judgement  Functional Limitation Self care  Self Care Current Status (Z6109(G8987) CJ  Self Care Goal Status (U0454(G8988) CI   Thanks! Doristine Sectionharity A Lakishia Bourassa, MS OTR/L  Pager: [phone]

## 2016-10-01 DIAGNOSIS — I951 Orthostatic hypotension: Secondary | ICD-10-CM | POA: Diagnosis not present

## 2016-10-01 DIAGNOSIS — R413 Other amnesia: Secondary | ICD-10-CM | POA: Diagnosis not present

## 2016-10-01 DIAGNOSIS — G20 Parkinson's disease: Secondary | ICD-10-CM | POA: Diagnosis not present

## 2016-10-01 DIAGNOSIS — F329 Major depressive disorder, single episode, unspecified: Secondary | ICD-10-CM | POA: Diagnosis not present

## 2016-10-01 DIAGNOSIS — G9341 Metabolic encephalopathy: Secondary | ICD-10-CM | POA: Diagnosis not present

## 2016-10-01 DIAGNOSIS — F0280 Dementia in other diseases classified elsewhere without behavioral disturbance: Secondary | ICD-10-CM | POA: Diagnosis not present

## 2016-10-02 DIAGNOSIS — M6240 Contracture of muscle, unspecified site: Secondary | ICD-10-CM | POA: Diagnosis not present

## 2016-10-02 DIAGNOSIS — F329 Major depressive disorder, single episode, unspecified: Secondary | ICD-10-CM | POA: Diagnosis not present

## 2016-10-02 DIAGNOSIS — M5137 Other intervertebral disc degeneration, lumbosacral region: Secondary | ICD-10-CM | POA: Diagnosis not present

## 2016-10-02 DIAGNOSIS — M791 Myalgia: Secondary | ICD-10-CM | POA: Diagnosis not present

## 2016-10-02 DIAGNOSIS — M545 Low back pain: Secondary | ICD-10-CM | POA: Diagnosis not present

## 2016-10-02 DIAGNOSIS — R413 Other amnesia: Secondary | ICD-10-CM | POA: Diagnosis not present

## 2016-10-02 DIAGNOSIS — M9901 Segmental and somatic dysfunction of cervical region: Secondary | ICD-10-CM | POA: Diagnosis not present

## 2016-10-02 DIAGNOSIS — F0280 Dementia in other diseases classified elsewhere without behavioral disturbance: Secondary | ICD-10-CM | POA: Diagnosis not present

## 2016-10-02 DIAGNOSIS — G20 Parkinson's disease: Secondary | ICD-10-CM | POA: Diagnosis not present

## 2016-10-02 DIAGNOSIS — M9903 Segmental and somatic dysfunction of lumbar region: Secondary | ICD-10-CM | POA: Diagnosis not present

## 2016-10-02 DIAGNOSIS — I951 Orthostatic hypotension: Secondary | ICD-10-CM | POA: Diagnosis not present

## 2016-10-02 DIAGNOSIS — G9341 Metabolic encephalopathy: Secondary | ICD-10-CM | POA: Diagnosis not present

## 2016-10-03 DIAGNOSIS — G20 Parkinson's disease: Secondary | ICD-10-CM | POA: Diagnosis not present

## 2016-10-03 DIAGNOSIS — G9341 Metabolic encephalopathy: Secondary | ICD-10-CM | POA: Diagnosis not present

## 2016-10-03 DIAGNOSIS — F0280 Dementia in other diseases classified elsewhere without behavioral disturbance: Secondary | ICD-10-CM | POA: Diagnosis not present

## 2016-10-03 DIAGNOSIS — F329 Major depressive disorder, single episode, unspecified: Secondary | ICD-10-CM | POA: Diagnosis not present

## 2016-10-03 DIAGNOSIS — I951 Orthostatic hypotension: Secondary | ICD-10-CM | POA: Diagnosis not present

## 2016-10-03 DIAGNOSIS — R413 Other amnesia: Secondary | ICD-10-CM | POA: Diagnosis not present

## 2016-10-04 DIAGNOSIS — M9903 Segmental and somatic dysfunction of lumbar region: Secondary | ICD-10-CM | POA: Diagnosis not present

## 2016-10-04 DIAGNOSIS — M542 Cervicalgia: Secondary | ICD-10-CM | POA: Diagnosis not present

## 2016-10-04 DIAGNOSIS — G20 Parkinson's disease: Secondary | ICD-10-CM | POA: Diagnosis not present

## 2016-10-04 DIAGNOSIS — M5137 Other intervertebral disc degeneration, lumbosacral region: Secondary | ICD-10-CM | POA: Diagnosis not present

## 2016-10-04 DIAGNOSIS — I951 Orthostatic hypotension: Secondary | ICD-10-CM | POA: Diagnosis not present

## 2016-10-04 DIAGNOSIS — M6240 Contracture of muscle, unspecified site: Secondary | ICD-10-CM | POA: Diagnosis not present

## 2016-10-04 DIAGNOSIS — M9901 Segmental and somatic dysfunction of cervical region: Secondary | ICD-10-CM | POA: Diagnosis not present

## 2016-10-04 DIAGNOSIS — F0280 Dementia in other diseases classified elsewhere without behavioral disturbance: Secondary | ICD-10-CM | POA: Diagnosis not present

## 2016-10-04 DIAGNOSIS — R413 Other amnesia: Secondary | ICD-10-CM | POA: Diagnosis not present

## 2016-10-04 DIAGNOSIS — M791 Myalgia: Secondary | ICD-10-CM | POA: Diagnosis not present

## 2016-10-04 DIAGNOSIS — F329 Major depressive disorder, single episode, unspecified: Secondary | ICD-10-CM | POA: Diagnosis not present

## 2016-10-04 DIAGNOSIS — G9341 Metabolic encephalopathy: Secondary | ICD-10-CM | POA: Diagnosis not present

## 2016-10-05 DIAGNOSIS — G20 Parkinson's disease: Secondary | ICD-10-CM | POA: Diagnosis not present

## 2016-10-05 DIAGNOSIS — R413 Other amnesia: Secondary | ICD-10-CM | POA: Diagnosis not present

## 2016-10-05 DIAGNOSIS — G9341 Metabolic encephalopathy: Secondary | ICD-10-CM | POA: Diagnosis not present

## 2016-10-05 DIAGNOSIS — F329 Major depressive disorder, single episode, unspecified: Secondary | ICD-10-CM | POA: Diagnosis not present

## 2016-10-05 DIAGNOSIS — I951 Orthostatic hypotension: Secondary | ICD-10-CM | POA: Diagnosis not present

## 2016-10-05 DIAGNOSIS — F0280 Dementia in other diseases classified elsewhere without behavioral disturbance: Secondary | ICD-10-CM | POA: Diagnosis not present

## 2016-10-09 DIAGNOSIS — M542 Cervicalgia: Secondary | ICD-10-CM | POA: Diagnosis not present

## 2016-10-09 DIAGNOSIS — F0280 Dementia in other diseases classified elsewhere without behavioral disturbance: Secondary | ICD-10-CM | POA: Diagnosis not present

## 2016-10-09 DIAGNOSIS — M6240 Contracture of muscle, unspecified site: Secondary | ICD-10-CM | POA: Diagnosis not present

## 2016-10-09 DIAGNOSIS — M9901 Segmental and somatic dysfunction of cervical region: Secondary | ICD-10-CM | POA: Diagnosis not present

## 2016-10-09 DIAGNOSIS — I951 Orthostatic hypotension: Secondary | ICD-10-CM | POA: Diagnosis not present

## 2016-10-09 DIAGNOSIS — M791 Myalgia: Secondary | ICD-10-CM | POA: Diagnosis not present

## 2016-10-09 DIAGNOSIS — M9903 Segmental and somatic dysfunction of lumbar region: Secondary | ICD-10-CM | POA: Diagnosis not present

## 2016-10-09 DIAGNOSIS — M5137 Other intervertebral disc degeneration, lumbosacral region: Secondary | ICD-10-CM | POA: Diagnosis not present

## 2016-10-09 DIAGNOSIS — F329 Major depressive disorder, single episode, unspecified: Secondary | ICD-10-CM | POA: Diagnosis not present

## 2016-10-09 DIAGNOSIS — G9341 Metabolic encephalopathy: Secondary | ICD-10-CM | POA: Diagnosis not present

## 2016-10-09 DIAGNOSIS — G20 Parkinson's disease: Secondary | ICD-10-CM | POA: Diagnosis not present

## 2016-10-09 DIAGNOSIS — R413 Other amnesia: Secondary | ICD-10-CM | POA: Diagnosis not present

## 2016-10-10 DIAGNOSIS — G20 Parkinson's disease: Secondary | ICD-10-CM | POA: Diagnosis not present

## 2016-10-10 DIAGNOSIS — G9340 Encephalopathy, unspecified: Secondary | ICD-10-CM | POA: Diagnosis not present

## 2016-10-10 DIAGNOSIS — I10 Essential (primary) hypertension: Secondary | ICD-10-CM | POA: Diagnosis not present

## 2016-10-11 DIAGNOSIS — M5137 Other intervertebral disc degeneration, lumbosacral region: Secondary | ICD-10-CM | POA: Diagnosis not present

## 2016-10-11 DIAGNOSIS — F329 Major depressive disorder, single episode, unspecified: Secondary | ICD-10-CM | POA: Diagnosis not present

## 2016-10-11 DIAGNOSIS — F0280 Dementia in other diseases classified elsewhere without behavioral disturbance: Secondary | ICD-10-CM | POA: Diagnosis not present

## 2016-10-11 DIAGNOSIS — R413 Other amnesia: Secondary | ICD-10-CM | POA: Diagnosis not present

## 2016-10-11 DIAGNOSIS — G9341 Metabolic encephalopathy: Secondary | ICD-10-CM | POA: Diagnosis not present

## 2016-10-11 DIAGNOSIS — M9903 Segmental and somatic dysfunction of lumbar region: Secondary | ICD-10-CM | POA: Diagnosis not present

## 2016-10-11 DIAGNOSIS — M791 Myalgia: Secondary | ICD-10-CM | POA: Diagnosis not present

## 2016-10-11 DIAGNOSIS — I951 Orthostatic hypotension: Secondary | ICD-10-CM | POA: Diagnosis not present

## 2016-10-11 DIAGNOSIS — M542 Cervicalgia: Secondary | ICD-10-CM | POA: Diagnosis not present

## 2016-10-11 DIAGNOSIS — G20 Parkinson's disease: Secondary | ICD-10-CM | POA: Diagnosis not present

## 2016-10-11 DIAGNOSIS — M6240 Contracture of muscle, unspecified site: Secondary | ICD-10-CM | POA: Diagnosis not present

## 2016-10-11 DIAGNOSIS — M9901 Segmental and somatic dysfunction of cervical region: Secondary | ICD-10-CM | POA: Diagnosis not present

## 2016-10-12 DIAGNOSIS — I951 Orthostatic hypotension: Secondary | ICD-10-CM | POA: Diagnosis not present

## 2016-10-12 DIAGNOSIS — G9341 Metabolic encephalopathy: Secondary | ICD-10-CM | POA: Diagnosis not present

## 2016-10-12 DIAGNOSIS — G20 Parkinson's disease: Secondary | ICD-10-CM | POA: Diagnosis not present

## 2016-10-12 DIAGNOSIS — F329 Major depressive disorder, single episode, unspecified: Secondary | ICD-10-CM | POA: Diagnosis not present

## 2016-10-12 DIAGNOSIS — F0280 Dementia in other diseases classified elsewhere without behavioral disturbance: Secondary | ICD-10-CM | POA: Diagnosis not present

## 2016-10-12 DIAGNOSIS — R413 Other amnesia: Secondary | ICD-10-CM | POA: Diagnosis not present

## 2016-10-15 ENCOUNTER — Emergency Department (HOSPITAL_COMMUNITY): Payer: Medicare Other

## 2016-10-15 ENCOUNTER — Encounter (HOSPITAL_COMMUNITY): Payer: Self-pay | Admitting: Emergency Medicine

## 2016-10-15 ENCOUNTER — Observation Stay (HOSPITAL_COMMUNITY)
Admission: EM | Admit: 2016-10-15 | Discharge: 2016-10-15 | Disposition: A | Discharge status: Home / Self Care | Payer: Medicare Other | Attending: Internal Medicine | Admitting: Internal Medicine

## 2016-10-15 ENCOUNTER — Telehealth: Payer: Self-pay | Admitting: Neurology

## 2016-10-15 DIAGNOSIS — R55 Syncope and collapse: Secondary | ICD-10-CM | POA: Diagnosis not present

## 2016-10-15 DIAGNOSIS — Z9849 Cataract extraction status, unspecified eye: Secondary | ICD-10-CM | POA: Diagnosis not present

## 2016-10-15 DIAGNOSIS — I493 Ventricular premature depolarization: Secondary | ICD-10-CM | POA: Insufficient documentation

## 2016-10-15 DIAGNOSIS — R2681 Unsteadiness on feet: Secondary | ICD-10-CM | POA: Insufficient documentation

## 2016-10-15 DIAGNOSIS — Y92481 Parking lot as the place of occurrence of the external cause: Secondary | ICD-10-CM | POA: Insufficient documentation

## 2016-10-15 DIAGNOSIS — F0280 Dementia in other diseases classified elsewhere without behavioral disturbance: Secondary | ICD-10-CM

## 2016-10-15 DIAGNOSIS — I7389 Other specified peripheral vascular diseases: Secondary | ICD-10-CM | POA: Diagnosis not present

## 2016-10-15 DIAGNOSIS — R413 Other amnesia: Secondary | ICD-10-CM | POA: Insufficient documentation

## 2016-10-15 DIAGNOSIS — G248 Other dystonia: Secondary | ICD-10-CM | POA: Diagnosis not present

## 2016-10-15 DIAGNOSIS — G20 Parkinson's disease: Secondary | ICD-10-CM | POA: Insufficient documentation

## 2016-10-15 DIAGNOSIS — Z79899 Other long term (current) drug therapy: Secondary | ICD-10-CM | POA: Diagnosis not present

## 2016-10-15 DIAGNOSIS — Z91018 Allergy to other foods: Secondary | ICD-10-CM | POA: Insufficient documentation

## 2016-10-15 DIAGNOSIS — I951 Orthostatic hypotension: Secondary | ICD-10-CM | POA: Insufficient documentation

## 2016-10-15 DIAGNOSIS — W010XXA Fall on same level from slipping, tripping and stumbling without subsequent striking against object, initial encounter: Secondary | ICD-10-CM | POA: Diagnosis not present

## 2016-10-15 DIAGNOSIS — Z87891 Personal history of nicotine dependence: Secondary | ICD-10-CM | POA: Diagnosis not present

## 2016-10-15 DIAGNOSIS — R404 Transient alteration of awareness: Secondary | ICD-10-CM | POA: Diagnosis not present

## 2016-10-15 DIAGNOSIS — S069X9A Unspecified intracranial injury with loss of consciousness of unspecified duration, initial encounter: Secondary | ICD-10-CM | POA: Diagnosis not present

## 2016-10-15 DIAGNOSIS — R32 Unspecified urinary incontinence: Secondary | ICD-10-CM | POA: Diagnosis not present

## 2016-10-15 LAB — CBC WITH DIFFERENTIAL/PLATELET
Basophils Absolute: 0 10*3/uL (ref 0.0–0.1)
Basophils Relative: 0 %
Eosinophils Absolute: 0.2 10*3/uL (ref 0.0–0.7)
Eosinophils Relative: 4 %
HCT: 31.1 % — ABNORMAL LOW (ref 39.0–52.0)
Hemoglobin: 10.4 g/dL — ABNORMAL LOW (ref 13.0–17.0)
Lymphocytes Relative: 21 %
Lymphs Abs: 1 10*3/uL (ref 0.7–4.0)
MCH: 26 pg (ref 26.0–34.0)
MCHC: 33.4 g/dL (ref 30.0–36.0)
MCV: 77.8 fL — ABNORMAL LOW (ref 78.0–100.0)
Monocytes Absolute: 0.3 10*3/uL (ref 0.1–1.0)
Monocytes Relative: 6 %
Neutro Abs: 3.3 10*3/uL (ref 1.7–7.7)
Neutrophils Relative %: 69 %
Platelets: 203 10*3/uL (ref 150–400)
RBC: 4 MIL/uL — ABNORMAL LOW (ref 4.22–5.81)
RDW: 14.6 % (ref 11.5–15.5)
WBC: 4.7 10*3/uL (ref 4.0–10.5)

## 2016-10-15 LAB — URINALYSIS, ROUTINE W REFLEX MICROSCOPIC
Bilirubin Urine: NEGATIVE
Glucose, UA: NEGATIVE mg/dL
Hgb urine dipstick: NEGATIVE
Ketones, ur: NEGATIVE mg/dL
Leukocytes, UA: NEGATIVE
Nitrite: NEGATIVE
Protein, ur: NEGATIVE mg/dL
Specific Gravity, Urine: 1.002 — ABNORMAL LOW (ref 1.005–1.030)
pH: 6 (ref 5.0–8.0)

## 2016-10-15 LAB — BASIC METABOLIC PANEL
Anion gap: 5 (ref 5–15)
BUN: 19 mg/dL (ref 6–20)
CO2: 28 mmol/L (ref 22–32)
Calcium: 8.8 mg/dL — ABNORMAL LOW (ref 8.9–10.3)
Chloride: 103 mmol/L (ref 101–111)
Creatinine, Ser: 0.71 mg/dL (ref 0.61–1.24)
GFR calc Af Amer: >60 mL/min (ref >60)
GFR calc non Af Amer: >60 mL/min (ref >60)
Glucose, Bld: 87 mg/dL (ref 65–99)
Potassium: 3.8 mmol/L (ref 3.5–5.1)
Sodium: 136 mmol/L (ref 135–145)

## 2016-10-15 LAB — I-STAT TROPONIN, ED: Troponin i, poc: 0.02 ng/mL (ref 0.00–0.08)

## 2016-10-15 MED ORDER — SODIUM CHLORIDE 0.9 % IV BOLUS (SEPSIS)
1000.0000 mL | Freq: Once | INTRAVENOUS | Status: AC
Start: 2016-10-15 — End: 2016-10-15
  Administered 2016-10-15: 1000 mL via INTRAVENOUS

## 2016-10-15 NOTE — H&P (Signed)
History and Physical    Moustafa Mossa UEA:[id number] DOB: 04-18-1946 DOA: 10/15/2016  PCP: Redmond Baseman, MD Consultants:  Anne Hahn - neurology Patient coming from: home - lives with wife; NOK: wife, [phone]  Chief Complaint: syncope  HPI: Bradley Fleming is a 71 y.o. male with medical history significant of Parkinson's disease with mild dementia and recurrent orthostatic syncope presenting with syncope.  He was unaccompanied at the time of my evaluation and so the history was limited.  Patient can't tell what happened.  "I knew that something was missing in the parking lot of the car.  Once it was all put together and they told me it was ... I had fallen out.  They had called the ambulance and I was taken to the hospital and that's basically what happened."  He was feeling great this morning before all of this happened.  He remembers being in the parking lot of [employer].  He imagines he fell but he did not hurt himself.  Has h/o orthostatic hypotension.  He does get light headed with standing and he thinks that happened this time.  Now, he feels sleepy.   ED Course: unremarkable evaluation  Review of Systems: As per HPI; otherwise 10 point review of systems reviewed and negative.  This is limited due to patient's dementia.  Ambulatory Status:  Ambulates with a cane or walker  Past Medical History:  Diagnosis Date  . Abnormality of gait 07/11/2015  . Camptocormia   . Focal dystonia   . Memory disorder 11/09/2015  . Parkinson disease [facility])     Past Surgical History:  Procedure Laterality Date  . ANKLE SURGERY Right   . CATARACT EXTRACTION      Social History   Social History  . Marital status: Married    Spouse name: N/A  . Number of children: 0  . Years of education: 14   Occupational History  . Retired    Social History Main Topics  . Smoking status: Former Games developer  . Smokeless tobacco: Never Used  . Alcohol use No     Comment: h/o social use  . Drug use:  No     Comment: remote h/o use - maybe 10 years ago  . Sexual activity: Not on file   Other Topics Concern  . Not on file   Social History Narrative   Lives at home, married   Patient drinks 1 soda daily.   Patient is right handed.     Allergies  Allergen Reactions  . Banana Hives    Family History  Problem Relation Age of Onset  . Heart failure Mother   . Cirrhosis Father   . Alcohol abuse Father   . Tremor Sister   . COPD Brother   . Diabetes Brother   . Heart failure Brother     Prior to Admission medications   Medication Sig Start Date End Date Taking? Authorizing Provider  carbidopa-levodopa (SINEMET CR) 50-200 MG tablet Take 1 tablet by mouth 3 (three) times daily. 03/15/16  Yes [name], MD  ENSURE (ENSURE) Take 237 mLs by mouth 2 (two) times daily between meals.   Yes Historical Provider, MD  gabapentin (NEURONTIN) 300 MG capsule Take 1 capsule (300 mg total) by mouth 3 (three) times daily. 03/27/16  Yes [name], MD  memantine (NAMENDA) 10 MG tablet Take 10 mg by mouth 2 (two) times daily. 09/03/16 09/03/17 Yes Historical Provider, MD  mirabegron ER (MYRBETRIQ) 25 MG TB24 tablet Take 25  mg by mouth daily.   Yes Historical Provider, MD  PARoxetine (PAXIL) 40 MG tablet Take 40 mg by mouth daily with breakfast.   Yes Historical Provider, MD  selegiline (ELDEPRYL) 5 MG tablet Take 5 mg by mouth 2 (two) times daily. 07/20/16  Yes Historical Provider, MD  hydrochlorothiazide (HYDRODIURIL) 25 MG tablet Take 1 tablet (25 mg total) by mouth daily. Patient not taking: Reported on 10/15/2016 02/11/16   Nelva Nay, MD  memantine [facility] TITRATION PACK) tablet pack Take by mouth See admin instructions. 5 mg/day for =1 week; 5 mg twice daily for =1 week; 15 mg/day given in 5 mg and 10 mg separated doses for =1 week; then 10 mg twice daily    Historical Provider, MD    Physical Exam: Vitals:   10/15/16 1701 10/15/16 1830 10/15/16 1910 10/15/16 2002  BP: (!) 143/96  (!) 157/85 (!) 158/101 (!) 161/106  Pulse: (!) 102 (!) 105 (!) 108 (!) 107  Resp: 11 16 (!) 21 (!) 22  Temp: (!) 96.4 F (35.8 C)   (!) 96.4 F (35.8 C)  TempSrc: Axillary   Oral  SpO2: 98% 99% 93% 97%  Weight:      Height:         General:  Appears calm and comfortable and is NAD, masked faces, stuttering speech Eyes:  PERRL, EOMI, normal lids, iris ENT:  grossly normal hearing, lips & tongue, mmm Neck:  no LAD, masses or thyromegaly Cardiovascular:  RRR, no m/r/g. No LE edema.  Respiratory:  CTA bilaterally, no w/r/r. Normal respiratory effort. Abdomen:  soft, ntnd, NABS Skin:  no rash or induration seen on limited exam Musculoskeletal:  +rigidity Psychiatric: blunted affect, AOx2, stuttering speech with many "um"s Neurologic:  CN 2-12 grossly intact, moves all extremities in coordinated fashion, sensation intact - limited exam  Labs on Admission: I have personally reviewed following labs and imaging studies  CBC:  Recent Labs Lab 10/15/16 1533  WBC 4.7  NEUTROABS 3.3  HGB 10.4*  HCT 31.1*  MCV 77.8*  PLT 203   Basic Metabolic Panel:  Recent Labs Lab 10/15/16 1533  NA 136  K 3.8  CL 103  CO2 28  GLUCOSE 87  BUN 19  CREATININE 0.71  CALCIUM 8.8*   GFR: Estimated Creatinine Clearance: 69.8 mL/min (by C-G formula based on SCr of 0.71 mg/dL). Liver Function Tests: No results for input(s): AST, ALT, ALKPHOS, BILITOT, PROT, ALBUMIN in the last 168 hours. No results for input(s): LIPASE, AMYLASE in the last 168 hours. No results for input(s): AMMONIA in the last 168 hours. Coagulation Profile: No results for input(s): INR, PROTIME in the last 168 hours. Cardiac Enzymes: No results for input(s): CKTOTAL, CKMB, CKMBINDEX, TROPONINI in the last 168 hours. BNP (last 3 results) No results for input(s): PROBNP in the last 8760 hours. HbA1C: No results for input(s): HGBA1C in the last 72 hours. CBG: No results for input(s): GLUCAP in the last 168 hours. Lipid  Profile: No results for input(s): CHOL, HDL, LDLCALC, TRIG, CHOLHDL, LDLDIRECT in the last 72 hours. Thyroid Function Tests: No results for input(s): TSH, T4TOTAL, FREET4, T3FREE, THYROIDAB in the last 72 hours. Anemia Panel: No results for input(s): VITAMINB12, FOLATE, FERRITIN, TIBC, IRON, RETICCTPCT in the last 72 hours. Urine analysis:    Component Value Date/Time   COLORURINE COLORLESS (A) 10/15/2016 1911   APPEARANCEUR CLEAR 10/15/2016 1911   LABSPEC 1.002 (L) 10/15/2016 1911   PHURINE 6.0 10/15/2016 1911   GLUCOSEU NEGATIVE 10/15/2016 1911  HGBUR NEGATIVE 10/15/2016 1911   BILIRUBINUR NEGATIVE 10/15/2016 1911   KETONESUR NEGATIVE 10/15/2016 1911   PROTEINUR NEGATIVE 10/15/2016 1911   NITRITE NEGATIVE 10/15/2016 1911   LEUKOCYTESUR NEGATIVE 10/15/2016 1911    Creatinine Clearance: Estimated Creatinine Clearance: 69.8 mL/min (by C-G formula based on SCr of 0.71 mg/dL).  Sepsis Labs: @LABRCNTIP (procalcitonin:4,lacticidven:4) )No results found for this or any previous visit (from the past 240 hour(s)).   Radiological Exams on Admission: Ct Head Wo Contrast  Result Date: 10/15/2016 CLINICAL DATA:  [facility]ence. Initial encounter. EXAM: CT HEAD WITHOUT CONTRAST TECHNIQUE: Contiguous axial images were obtained from the base of the skull through the vertex without intravenous contrast. COMPARISON:  CT of the head performed 08/17/2016 FINDINGS: Brain: No evidence of acute infarction, hemorrhage, hydrocephalus, extra-axial collection or mass lesion/mass effect. Prominence of the ventricles and sulci reflects mild to moderate cortical volume loss. Mild cerebellar atrophy is noted. Scattered periventricular and subcortical white matter change likely reflects small vessel ischemic microangiopathy. Chronic ischemic change is noted at the basal ganglia bilaterally. The brainstem and fourth ventricle are within normal limits. The  cerebral hemispheres demonstrate grossly normal gray-white differentiation. No mass effect or midline shift is seen. Vascular: No hyperdense vessel or unexpected calcification. Skull: There is no evidence of fracture; visualized osseous structures are unremarkable in appearance. Sinuses/Orbits: The orbits are within normal limits. The paranasal sinuses and mastoid air cells are well-aerated. Other: No significant soft tissue abnormalities are seen. IMPRESSION: 1. No evidence of traumatic intracranial injury or fracture. 2. Mild to moderate cortical volume loss and scattered small vessel ischemic microangiopathy. 3. Chronic ischemic change at the basal ganglia bilaterally. Electronically Signed   By: Roanna RaiderJeffery  Chang M.D.   On: 10/15/2016 18:51    EKG: Independently reviewed.  NSR with rate 95; nonspecific ST changes with no evidence of acute ischemia, NSCSLT  Assessment/Plan Principal Problem:   Syncope Active Problems:   Dementia due to Parkinson's disease without behavioral disturbance (HCC)   -Syncope:  -Etiology is most likely orthostatic hypotension, as this has been a recurrent problem for him -By [facility] syncope rule, the patient is at low risk for serious outcome -Of note, he did have urinary incontinence around the time of the syncope, but his wife reports that this is common for him and occurred before the syncope; would consider EEG -Will monitor on telemetry -Orthostatic vital signs in AM -Consider 2d echo -Neuro checks  -start ASA -PT/OT eval and treat -Hgb 10.4, stable   DVT prophylaxis:  Lovenox Code Status:  Full - confirmed with patient Family Communication: Wife came in later and so was not present for initial complete evaluation Disposition Plan:  Home once clinically improved Consults called: None  Admission status: It is my clinical opinion that referral for OBSERVATION is reasonable and necessary in this patient based on the above information provided. The  aforementioned taken together are felt to place the patient at high risk for further clinical deterioration. However it is anticipated that the patient may be medically stable for discharge from the hospital within 24 to 48 hours.  *NOTE: orders were written and H&P initiated and then the wife showed up and decided to have the patient leave AMA.  During the entire time she was talking, he never uttered a word (despite providing the entire history before her arrival).  She is very much in charge.  Would consider home visit from DSS to ensure no concern  for abuse/neglect.  They signed out Against Medicine Advice.   Jonah Blue MD [facility]  If 7PM-7AM, please contact night-coverage [email or web address] Password [facility]  10/16/2016, 12:51 AM

## 2016-10-15 NOTE — ED Triage Notes (Signed)
Pt is from home with wife.  [facility] and had +LOC, fell to floor and was incontinent per wife.  Pt did nto strike his head, denies pain/injury.  Pt was orthostatic with EMS.

## 2016-10-15 NOTE — ED Provider Notes (Signed)
Medical screening examination/treatment/procedure(s) were conducted as a shared visit with non-physician practitioner(s) and myself.  I personally evaluated the patient during the encounter.   EKG Interpretation  Date/Time:  Monday October 15 2016 17:06:08 EDT Ventricular Rate:  95 PR Interval:    QRS Duration: 87 QT Interval:  371 QTC Calculation: 467 R Axis:   -18 Text Interpretation:  Sinus rhythm Ventricular premature complex Left ventricular hypertrophy Inferior infarct, old No significant change since last tracing Confirmed by Ethelda ChickJACUBOWITZ  MD, SAM [id number]) on 10/15/2016 5:18:4926 PM     71 year old male today who was sitting at a restaurant when he had a sudden syncopal event. No prodromal symptoms. Had bladder incontinence. Feels better at this time. No tongue injury. Will admit for syncope workup   Lorre NickAnthony Zurii Hewes, MD 10/15/16 1816

## 2016-10-15 NOTE — ED Provider Notes (Signed)
[facility]-EMERGENCY DEPT Provider Note   CSN: [id number] Arrival date & time: 10/15/16  1435     History   Chief Complaint Chief Complaint  Patient presents with  . Loss of Consciousness    HPI Bradley Fleming is a 71 y.o. male with history of Parkinson's disease, orthostatic hypotension who presents following syncopal episode. Patient was with his wife in [place] when he stood up and collapsed. He had urinary incontinence at the time. Patient reports that he does not remember much about his morning prior to leaving [facility] and an ambulance. He does remember that he was at a night last store prior to lunch, but remembers nothing else about the morning. Patient does remember what he did yesterday. Patient feels generally fatigued, but denies any pain or numbness or tingling. He denies any vision changes since the incident. Patient does state he feels dizzy and lightheaded when standing up, but not at rest. Patient also denies any chest pain, shortness of breath, abdominal pain, nausea, vomiting. Patient reports that this only happened in the past when he tripped and fell and lost consciousness. However, per review of medical records, patient was admitted for encephalopathy on 09/21/2016 he was found unresponsive at home.  HPI  Past Medical History:  Diagnosis Date  . Abnormality of gait 07/11/2015  . Camptocormia   . Focal dystonia   . Memory disorder 11/09/2015  . Parkinson disease [facility])     Patient Active Problem List   Diagnosis Date Noted  . Syncope 10/15/2016  . Dementia due to Parkinson's disease without behavioral disturbance (HCC)   . Encephalopathy acute 09/21/2016  . Memory disorder 11/09/2015  . Parkinson disease (HCC) 07/11/2015    Past Surgical History:  Procedure Laterality Date  . ANKLE SURGERY Right   . CATARACT EXTRACTION         Home Medications    Prior to Admission medications   Medication Sig Start Date End Date Taking? Authorizing Provider    carbidopa-levodopa (SINEMET CR) 50-200 MG tablet Take 1 tablet by mouth 3 (three) times daily. 03/15/16  Yes [name], MD  ENSURE (ENSURE) Take 237 mLs by mouth 2 (two) times daily between meals.   Yes Historical Provider, MD  gabapentin (NEURONTIN) 300 MG capsule Take 1 capsule (300 mg total) by mouth 3 (three) times daily. 03/27/16  Yes [name], MD  memantine (NAMENDA) 10 MG tablet Take 10 mg by mouth 2 (two) times daily. 09/03/16 09/03/17 Yes Historical Provider, MD  mirabegron ER (MYRBETRIQ) 25 MG TB24 tablet Take 25 mg by mouth daily.   Yes Historical Provider, MD  PARoxetine (PAXIL) 40 MG tablet Take 40 mg by mouth daily with breakfast.   Yes Historical Provider, MD  selegiline (ELDEPRYL) 5 MG tablet Take 5 mg by mouth 2 (two) times daily. 07/20/16  Yes Historical Provider, MD  hydrochlorothiazide (HYDRODIURIL) 25 MG tablet Take 1 tablet (25 mg total) by mouth daily. Patient not taking: Reported on 10/15/2016 02/11/16   Nelva Nay, MD  memantine [facility] TITRATION PACK) tablet pack Take by mouth See admin instructions. 5 mg/day for =1 week; 5 mg twice daily for =1 week; 15 mg/day given in 5 mg and 10 mg separated doses for =1 week; then 10 mg twice daily    Historical Provider, MD    Family History Family History  Problem Relation Age of Onset  . Heart failure Mother   . Cirrhosis Father   . Alcohol abuse Father   . Tremor Sister   .  COPD Brother   . Diabetes Brother   . Heart failure Brother     Social History Social History  Substance Use Topics  . Smoking status: Former Games developermoker  . Smokeless tobacco: Never Used  . Alcohol use No     Comment: h/o social use     Allergies   Banana   Review of Systems Review of Systems  Constitutional: Positive for fatigue. Negative for chills and fever.  HENT: Negative for facial swelling and sore throat.   Respiratory: Negative for shortness of breath.   Cardiovascular: Negative for chest pain.  Gastrointestinal:  Negative for abdominal pain, nausea and vomiting.  Genitourinary: Negative for dysuria.  Musculoskeletal: Negative for back pain.  Skin: Negative for rash and wound.  Neurological: Positive for dizziness, syncope, weakness (generalized) and light-headedness. Negative for headaches.  Psychiatric/Behavioral: The patient is not nervous/anxious.      Physical Exam Updated Vital Signs BP (!) 158/101   Pulse (!) 108   Temp (!) 96.4 F (35.8 C) (Axillary)   Resp (!) 21   Ht 5\' 4"  (1.626 m)   Wt 55.8 kg   SpO2 93%   BMI 21.11 kg/m   Physical Exam  Constitutional: He appears well-developed and well-nourished. No distress.  HENT:  Head: Normocephalic and atraumatic.  Mouth/Throat: Oropharynx is clear and moist. No oropharyngeal exudate.  Eyes: Conjunctivae and EOM are normal. Pupils are equal, round, and reactive to light. Right eye exhibits no discharge. Left eye exhibits no discharge. No scleral icterus.  Neck: Normal range of motion. Neck supple. No thyromegaly present.  Cardiovascular: Normal rate, regular rhythm, normal heart sounds and intact distal pulses.  Exam reveals no gallop and no friction rub.   No murmur heard. Pulmonary/Chest: Effort normal and breath sounds normal. No stridor. No respiratory distress. He has no wheezes. He has no rales.  Abdominal: Soft. Bowel sounds are normal. He exhibits no distension. There is no tenderness. There is no rebound and no guarding.  Musculoskeletal: He exhibits no edema.  Lymphadenopathy:    He has no cervical adenopathy.  Neurological: He is alert. Coordination normal.  CN 3-12 intact; normal sensation throughout; 5/5 strength in all 4 extremities; equal bilateral grip strength; no ataxia on finger to nose; normal heel-to-shin test  Skin: Skin is warm and dry. No rash noted. He is not diaphoretic. No pallor.  Psychiatric: He has a normal mood and affect.  Nursing note and vitals reviewed.    ED Treatments / Results  Labs (all  labs ordered are listed, but only abnormal results are displayed) Labs Reviewed  BASIC METABOLIC PANEL - Abnormal; Notable for the following:       Result Value   Calcium 8.8 (*)    All other components within normal limits  CBC WITH DIFFERENTIAL/PLATELET - Abnormal; Notable for the following:    RBC 4.00 (*)    Hemoglobin 10.4 (*)    HCT 31.1 (*)    MCV 77.8 (*)    All other components within normal limits  URINALYSIS, ROUTINE W REFLEX MICROSCOPIC - Abnormal; Notable for the following:    Color, Urine COLORLESS (*)    Specific Gravity, Urine 1.002 (*)    All other components within normal limits  I-STAT TROPOININ, ED    EKG  EKG Interpretation  Date/Time:  Monday October 15 2016 17:06:08 EDT Ventricular Rate:  95 PR Interval:    QRS Duration: 87 QT Interval:  371 QTC Calculation: 467 R Axis:   -18 Text Interpretation:  Sinus rhythm Ventricular premature complex Left ventricular hypertrophy Inferior infarct, old No significant change since last tracing Confirmed by Ethelda Chick  MD, SAM [phone]) on 10/15/2016 5:18:26 PM       Radiology Ct Head Wo Contrast  Result Date: 10/15/2016 CLINICAL DATA:  Acute onset of loss of consciousness and fall while at [facility]. Incontinence. Initial encounter. EXAM: CT HEAD WITHOUT CONTRAST TECHNIQUE: Contiguous axial images were obtained from the base of the skull through the vertex without intravenous contrast. COMPARISON:  CT of the head performed 08/17/2016 FINDINGS: Brain: No evidence of acute infarction, hemorrhage, hydrocephalus, extra-axial collection or mass lesion/mass effect. Prominence of the ventricles and sulci reflects mild to moderate cortical volume loss. Mild cerebellar atrophy is noted. Scattered periventricular and subcortical white matter change likely reflects small vessel ischemic microangiopathy. Chronic ischemic change is noted at the basal ganglia bilaterally. The brainstem and fourth ventricle are within normal limits. The  cerebral hemispheres demonstrate grossly normal gray-white differentiation. No mass effect or midline shift is seen. Vascular: No hyperdense vessel or unexpected calcification. Skull: There is no evidence of fracture; visualized osseous structures are unremarkable in appearance. Sinuses/Orbits: The orbits are within normal limits. The paranasal sinuses and mastoid air cells are well-aerated. Other: No significant soft tissue abnormalities are seen. IMPRESSION: 1. No evidence of traumatic intracranial injury or fracture. 2. Mild to moderate cortical volume loss and scattered small vessel ischemic microangiopathy. 3. Chronic ischemic change at the basal ganglia bilaterally. Electronically Signed   By: Roanna Raider M.D.   On: 10/15/2016 18:51    Procedures Procedures (including critical care time)  Medications Ordered in ED Medications  sodium chloride 0.9 % bolus 1,000 mL (1,000 mLs Intravenous New Bag/Given 10/15/16 1724)     Initial Impression / Assessment and Plan / ED Course  I have reviewed the triage vital signs and the nursing notes.  Pertinent labs & imaging results that were available during my care of the patient were reviewed by me and considered in my medical decision making (see chart for details).     Patient with syncope with some anterograde amnesia. CBC shows stable chronic anemia, hemoglobin 10.4. BMP shows calcium 8.8. Troponin negative. EKG shows NSR, PVCs, left ventricular hypertrophy, but no significant change since last tracing. CT head shows no evidence of traumatic intracranial injury or fracture; a mild to moderate cortical volume loss and scattered small vessel ischemic microangiopathy; chronic ischemic change in the basal ganglia bilaterally. Considering patient's syncope and amnesia, myself and Dr. Freida Busman feel patient should be admitted for syncopal workup. I spoke with hospitalist, Dr. Ophelia Charter, who will make the patient for further workup.  Final Clinical Impressions(s)  / ED Diagnoses   Final diagnoses:  Syncope and collapse    New Prescriptions New Prescriptions   No medications on file     Emi Holes, PA-C 10/15/16 1959

## 2016-10-15 NOTE — ED Notes (Signed)
Pts wife demands that pt be allowed to go home. Wife states "I can observe him at home." EDP aware.

## 2016-10-15 NOTE — Telephone Encounter (Signed)
Pt wife called to advise that her husband is now seeing Dr Fidela [facility]auooney [facility] and they will be handling all concerns re: his medication

## 2016-10-15 NOTE — ED Notes (Signed)
WARM BLANKETS PLACED ON PT DUE TO RECTAL TEMP.

## 2016-10-15 NOTE — ED Notes (Signed)
Pt's rectal temp taken was 97.0

## 2016-10-15 NOTE — Telephone Encounter (Signed)
Events noted

## 2016-10-15 NOTE — ED Notes (Signed)
EDPA Provider at bedside. 

## 2016-10-15 NOTE — ED Notes (Signed)
PT AWARE OF NEED FOR UA HOWEVER UNABLE TO GO AT THIS TIME

## 2016-10-18 DIAGNOSIS — M542 Cervicalgia: Secondary | ICD-10-CM | POA: Diagnosis not present

## 2016-10-18 DIAGNOSIS — M6240 Contracture of muscle, unspecified site: Secondary | ICD-10-CM | POA: Diagnosis not present

## 2016-10-18 DIAGNOSIS — M5137 Other intervertebral disc degeneration, lumbosacral region: Secondary | ICD-10-CM | POA: Diagnosis not present

## 2016-10-18 DIAGNOSIS — M791 Myalgia: Secondary | ICD-10-CM | POA: Diagnosis not present

## 2016-10-18 DIAGNOSIS — M9903 Segmental and somatic dysfunction of lumbar region: Secondary | ICD-10-CM | POA: Diagnosis not present

## 2016-10-18 DIAGNOSIS — M9901 Segmental and somatic dysfunction of cervical region: Secondary | ICD-10-CM | POA: Diagnosis not present

## 2016-10-23 DIAGNOSIS — F0280 Dementia in other diseases classified elsewhere without behavioral disturbance: Secondary | ICD-10-CM | POA: Diagnosis not present

## 2016-10-23 DIAGNOSIS — R413 Other amnesia: Secondary | ICD-10-CM | POA: Diagnosis not present

## 2016-10-23 DIAGNOSIS — F329 Major depressive disorder, single episode, unspecified: Secondary | ICD-10-CM | POA: Diagnosis not present

## 2016-10-23 DIAGNOSIS — M5137 Other intervertebral disc degeneration, lumbosacral region: Secondary | ICD-10-CM | POA: Diagnosis not present

## 2016-10-23 DIAGNOSIS — M9903 Segmental and somatic dysfunction of lumbar region: Secondary | ICD-10-CM | POA: Diagnosis not present

## 2016-10-23 DIAGNOSIS — M9901 Segmental and somatic dysfunction of cervical region: Secondary | ICD-10-CM | POA: Diagnosis not present

## 2016-10-23 DIAGNOSIS — M6240 Contracture of muscle, unspecified site: Secondary | ICD-10-CM | POA: Diagnosis not present

## 2016-10-23 DIAGNOSIS — G9341 Metabolic encephalopathy: Secondary | ICD-10-CM | POA: Diagnosis not present

## 2016-10-23 DIAGNOSIS — I951 Orthostatic hypotension: Secondary | ICD-10-CM | POA: Diagnosis not present

## 2016-10-23 DIAGNOSIS — G20 Parkinson's disease: Secondary | ICD-10-CM | POA: Diagnosis not present

## 2016-10-25 DIAGNOSIS — M9903 Segmental and somatic dysfunction of lumbar region: Secondary | ICD-10-CM | POA: Diagnosis not present

## 2016-10-25 DIAGNOSIS — M5137 Other intervertebral disc degeneration, lumbosacral region: Secondary | ICD-10-CM | POA: Diagnosis not present

## 2016-10-25 DIAGNOSIS — G20 Parkinson's disease: Secondary | ICD-10-CM | POA: Diagnosis not present

## 2016-10-25 DIAGNOSIS — R351 Nocturia: Secondary | ICD-10-CM | POA: Diagnosis not present

## 2016-10-25 DIAGNOSIS — M6240 Contracture of muscle, unspecified site: Secondary | ICD-10-CM | POA: Diagnosis not present

## 2016-10-25 DIAGNOSIS — M9901 Segmental and somatic dysfunction of cervical region: Secondary | ICD-10-CM | POA: Diagnosis not present

## 2016-10-25 DIAGNOSIS — I998 Other disorder of circulatory system: Secondary | ICD-10-CM | POA: Diagnosis not present

## 2016-10-25 DIAGNOSIS — Z09 Encounter for follow-up examination after completed treatment for conditions other than malignant neoplasm: Secondary | ICD-10-CM | POA: Diagnosis not present

## 2016-10-25 DIAGNOSIS — N3944 Nocturnal enuresis: Secondary | ICD-10-CM | POA: Diagnosis not present

## 2016-10-25 DIAGNOSIS — R55 Syncope and collapse: Secondary | ICD-10-CM | POA: Diagnosis not present

## 2016-10-28 DIAGNOSIS — R079 Chest pain, unspecified: Secondary | ICD-10-CM | POA: Diagnosis not present

## 2016-10-28 DIAGNOSIS — I951 Orthostatic hypotension: Secondary | ICD-10-CM | POA: Diagnosis not present

## 2016-10-28 DIAGNOSIS — I491 Atrial premature depolarization: Secondary | ICD-10-CM | POA: Diagnosis not present

## 2016-10-28 DIAGNOSIS — I4949 Other premature depolarization: Secondary | ICD-10-CM | POA: Diagnosis present

## 2016-10-28 DIAGNOSIS — R55 Syncope and collapse: Secondary | ICD-10-CM | POA: Diagnosis not present

## 2016-10-28 DIAGNOSIS — I358 Other nonrheumatic aortic valve disorders: Secondary | ICD-10-CM | POA: Diagnosis present

## 2016-10-28 DIAGNOSIS — I10 Essential (primary) hypertension: Secondary | ICD-10-CM | POA: Diagnosis not present

## 2016-10-28 DIAGNOSIS — Z23 Encounter for immunization: Secondary | ICD-10-CM | POA: Diagnosis not present

## 2016-10-28 DIAGNOSIS — F329 Major depressive disorder, single episode, unspecified: Secondary | ICD-10-CM | POA: Diagnosis not present

## 2016-10-28 DIAGNOSIS — R0602 Shortness of breath: Secondary | ICD-10-CM | POA: Diagnosis not present

## 2016-10-28 DIAGNOSIS — G20 Parkinson's disease: Secondary | ICD-10-CM | POA: Diagnosis not present

## 2016-10-28 DIAGNOSIS — F0280 Dementia in other diseases classified elsewhere without behavioral disturbance: Secondary | ICD-10-CM | POA: Diagnosis not present

## 2016-10-28 DIAGNOSIS — Z9119 Patient's noncompliance with other medical treatment and regimen: Secondary | ICD-10-CM | POA: Diagnosis not present

## 2016-10-28 DIAGNOSIS — R7989 Other specified abnormal findings of blood chemistry: Secondary | ICD-10-CM | POA: Diagnosis not present

## 2016-10-29 DIAGNOSIS — R079 Chest pain, unspecified: Secondary | ICD-10-CM | POA: Diagnosis not present

## 2016-10-29 DIAGNOSIS — R55 Syncope and collapse: Secondary | ICD-10-CM | POA: Diagnosis not present

## 2016-10-29 DIAGNOSIS — R7989 Other specified abnormal findings of blood chemistry: Secondary | ICD-10-CM | POA: Diagnosis not present

## 2016-10-29 DIAGNOSIS — I10 Essential (primary) hypertension: Secondary | ICD-10-CM | POA: Diagnosis not present

## 2016-10-29 DIAGNOSIS — I951 Orthostatic hypotension: Secondary | ICD-10-CM | POA: Diagnosis not present

## 2016-10-29 DIAGNOSIS — G20 Parkinson's disease: Secondary | ICD-10-CM | POA: Diagnosis not present

## 2016-10-29 DIAGNOSIS — R0602 Shortness of breath: Secondary | ICD-10-CM | POA: Diagnosis not present

## 2016-10-29 DIAGNOSIS — I358 Other nonrheumatic aortic valve disorders: Secondary | ICD-10-CM | POA: Diagnosis not present

## 2016-10-29 DIAGNOSIS — F0280 Dementia in other diseases classified elsewhere without behavioral disturbance: Secondary | ICD-10-CM | POA: Diagnosis not present

## 2016-10-29 DIAGNOSIS — I491 Atrial premature depolarization: Secondary | ICD-10-CM | POA: Diagnosis not present

## 2016-10-29 DIAGNOSIS — F329 Major depressive disorder, single episode, unspecified: Secondary | ICD-10-CM | POA: Diagnosis not present

## 2016-10-29 DIAGNOSIS — I4949 Other premature depolarization: Secondary | ICD-10-CM | POA: Diagnosis not present

## 2016-10-30 DIAGNOSIS — M6240 Contracture of muscle, unspecified site: Secondary | ICD-10-CM | POA: Diagnosis not present

## 2016-10-30 DIAGNOSIS — M5137 Other intervertebral disc degeneration, lumbosacral region: Secondary | ICD-10-CM | POA: Diagnosis not present

## 2016-10-30 DIAGNOSIS — M9903 Segmental and somatic dysfunction of lumbar region: Secondary | ICD-10-CM | POA: Diagnosis not present

## 2016-10-30 DIAGNOSIS — M9901 Segmental and somatic dysfunction of cervical region: Secondary | ICD-10-CM | POA: Diagnosis not present

## 2016-11-01 DIAGNOSIS — M5127 Other intervertebral disc displacement, lumbosacral region: Secondary | ICD-10-CM | POA: Diagnosis not present

## 2016-11-01 DIAGNOSIS — M6240 Contracture of muscle, unspecified site: Secondary | ICD-10-CM | POA: Diagnosis not present

## 2016-11-01 DIAGNOSIS — M791 Myalgia: Secondary | ICD-10-CM | POA: Diagnosis not present

## 2016-11-01 DIAGNOSIS — M9903 Segmental and somatic dysfunction of lumbar region: Secondary | ICD-10-CM | POA: Diagnosis not present

## 2016-11-01 DIAGNOSIS — M461 Sacroiliitis, not elsewhere classified: Secondary | ICD-10-CM | POA: Diagnosis not present

## 2016-11-01 DIAGNOSIS — M5137 Other intervertebral disc degeneration, lumbosacral region: Secondary | ICD-10-CM | POA: Diagnosis not present

## 2016-11-01 DIAGNOSIS — M9901 Segmental and somatic dysfunction of cervical region: Secondary | ICD-10-CM | POA: Diagnosis not present

## 2016-11-07 DIAGNOSIS — F444 Conversion disorder with motor symptom or deficit: Secondary | ICD-10-CM | POA: Diagnosis not present

## 2016-11-07 DIAGNOSIS — F0391 Unspecified dementia with behavioral disturbance: Secondary | ICD-10-CM | POA: Diagnosis not present

## 2016-11-07 DIAGNOSIS — G20 Parkinson's disease: Secondary | ICD-10-CM | POA: Diagnosis not present

## 2016-11-09 DIAGNOSIS — G9341 Metabolic encephalopathy: Secondary | ICD-10-CM | POA: Diagnosis not present

## 2016-11-09 DIAGNOSIS — R413 Other amnesia: Secondary | ICD-10-CM | POA: Diagnosis not present

## 2016-11-09 DIAGNOSIS — I951 Orthostatic hypotension: Secondary | ICD-10-CM | POA: Diagnosis not present

## 2016-11-09 DIAGNOSIS — F0280 Dementia in other diseases classified elsewhere without behavioral disturbance: Secondary | ICD-10-CM | POA: Diagnosis not present

## 2016-11-09 DIAGNOSIS — G20 Parkinson's disease: Secondary | ICD-10-CM | POA: Diagnosis not present

## 2016-11-09 DIAGNOSIS — F329 Major depressive disorder, single episode, unspecified: Secondary | ICD-10-CM | POA: Diagnosis not present

## 2016-11-12 DIAGNOSIS — I998 Other disorder of circulatory system: Secondary | ICD-10-CM | POA: Diagnosis not present

## 2016-11-12 DIAGNOSIS — G20 Parkinson's disease: Secondary | ICD-10-CM | POA: Diagnosis not present

## 2016-11-23 DIAGNOSIS — G909 Disorder of the autonomic nervous system, unspecified: Secondary | ICD-10-CM | POA: Diagnosis not present

## 2016-11-23 DIAGNOSIS — R002 Palpitations: Secondary | ICD-10-CM | POA: Diagnosis not present

## 2016-11-24 DIAGNOSIS — I491 Atrial premature depolarization: Secondary | ICD-10-CM | POA: Diagnosis not present

## 2016-11-28 DIAGNOSIS — R42 Dizziness and giddiness: Secondary | ICD-10-CM | POA: Diagnosis not present

## 2016-11-28 DIAGNOSIS — I491 Atrial premature depolarization: Secondary | ICD-10-CM | POA: Diagnosis not present

## 2016-11-28 DIAGNOSIS — R55 Syncope and collapse: Secondary | ICD-10-CM | POA: Diagnosis not present

## 2016-11-28 DIAGNOSIS — Z5181 Encounter for therapeutic drug level monitoring: Secondary | ICD-10-CM | POA: Diagnosis not present

## 2016-11-28 DIAGNOSIS — R0989 Other specified symptoms and signs involving the circulatory and respiratory systems: Secondary | ICD-10-CM | POA: Diagnosis not present

## 2016-11-28 DIAGNOSIS — I517 Cardiomegaly: Secondary | ICD-10-CM | POA: Diagnosis not present

## 2016-11-28 DIAGNOSIS — Z87891 Personal history of nicotine dependence: Secondary | ICD-10-CM | POA: Diagnosis not present

## 2016-11-29 DIAGNOSIS — R55 Syncope and collapse: Secondary | ICD-10-CM | POA: Diagnosis not present

## 2016-12-07 DIAGNOSIS — Z136 Encounter for screening for cardiovascular disorders: Secondary | ICD-10-CM | POA: Diagnosis not present

## 2016-12-07 DIAGNOSIS — M79604 Pain in right leg: Secondary | ICD-10-CM | POA: Diagnosis not present

## 2016-12-07 DIAGNOSIS — Z1389 Encounter for screening for other disorder: Secondary | ICD-10-CM | POA: Diagnosis not present

## 2016-12-07 DIAGNOSIS — Z0000 Encounter for general adult medical examination without abnormal findings: Secondary | ICD-10-CM | POA: Diagnosis not present

## 2016-12-07 DIAGNOSIS — R0602 Shortness of breath: Secondary | ICD-10-CM | POA: Diagnosis not present

## 2016-12-07 DIAGNOSIS — R0989 Other specified symptoms and signs involving the circulatory and respiratory systems: Secondary | ICD-10-CM | POA: Diagnosis not present

## 2016-12-07 DIAGNOSIS — Z79899 Other long term (current) drug therapy: Secondary | ICD-10-CM | POA: Diagnosis not present

## 2016-12-07 DIAGNOSIS — Z87891 Personal history of nicotine dependence: Secondary | ICD-10-CM | POA: Diagnosis not present

## 2016-12-07 DIAGNOSIS — R5383 Other fatigue: Secondary | ICD-10-CM | POA: Diagnosis not present

## 2016-12-07 DIAGNOSIS — E7800 Pure hypercholesterolemia, unspecified: Secondary | ICD-10-CM | POA: Diagnosis not present

## 2016-12-07 DIAGNOSIS — M79605 Pain in left leg: Secondary | ICD-10-CM | POA: Diagnosis not present

## 2016-12-07 DIAGNOSIS — E559 Vitamin D deficiency, unspecified: Secondary | ICD-10-CM | POA: Diagnosis not present

## 2016-12-07 DIAGNOSIS — I10 Essential (primary) hypertension: Secondary | ICD-10-CM | POA: Diagnosis not present

## 2016-12-19 DIAGNOSIS — T8522XA Displacement of intraocular lens, initial encounter: Secondary | ICD-10-CM | POA: Diagnosis not present

## 2016-12-19 DIAGNOSIS — H47329 Drusen of optic disc, unspecified eye: Secondary | ICD-10-CM | POA: Diagnosis not present

## 2016-12-19 DIAGNOSIS — H40023 Open angle with borderline findings, high risk, bilateral: Secondary | ICD-10-CM | POA: Diagnosis not present

## 2016-12-21 DIAGNOSIS — E559 Vitamin D deficiency, unspecified: Secondary | ICD-10-CM | POA: Diagnosis not present

## 2016-12-21 DIAGNOSIS — I10 Essential (primary) hypertension: Secondary | ICD-10-CM | POA: Diagnosis not present

## 2016-12-21 DIAGNOSIS — Z1389 Encounter for screening for other disorder: Secondary | ICD-10-CM | POA: Diagnosis not present

## 2016-12-21 DIAGNOSIS — E7800 Pure hypercholesterolemia, unspecified: Secondary | ICD-10-CM | POA: Diagnosis not present

## 2017-03-12 DIAGNOSIS — F0280 Dementia in other diseases classified elsewhere without behavioral disturbance: Secondary | ICD-10-CM | POA: Diagnosis not present

## 2017-03-12 DIAGNOSIS — G20 Parkinson's disease: Secondary | ICD-10-CM | POA: Diagnosis not present

## 2017-03-25 DIAGNOSIS — E559 Vitamin D deficiency, unspecified: Secondary | ICD-10-CM | POA: Diagnosis not present

## 2017-03-25 DIAGNOSIS — Z79899 Other long term (current) drug therapy: Secondary | ICD-10-CM | POA: Diagnosis not present

## 2017-03-25 DIAGNOSIS — I10 Essential (primary) hypertension: Secondary | ICD-10-CM | POA: Diagnosis not present

## 2017-03-25 DIAGNOSIS — E7800 Pure hypercholesterolemia, unspecified: Secondary | ICD-10-CM | POA: Diagnosis not present

## 2017-03-27 DIAGNOSIS — G20 Parkinson's disease: Secondary | ICD-10-CM | POA: Diagnosis not present

## 2017-03-27 DIAGNOSIS — F0280 Dementia in other diseases classified elsewhere without behavioral disturbance: Secondary | ICD-10-CM | POA: Diagnosis not present

## 2017-03-27 DIAGNOSIS — R32 Unspecified urinary incontinence: Secondary | ICD-10-CM | POA: Diagnosis not present

## 2017-04-20 DIAGNOSIS — M48061 Spinal stenosis, lumbar region without neurogenic claudication: Secondary | ICD-10-CM | POA: Diagnosis not present

## 2017-04-20 DIAGNOSIS — Z9181 History of falling: Secondary | ICD-10-CM | POA: Diagnosis not present

## 2017-04-20 DIAGNOSIS — G20 Parkinson's disease: Secondary | ICD-10-CM | POA: Diagnosis not present

## 2017-04-20 DIAGNOSIS — F0280 Dementia in other diseases classified elsewhere without behavioral disturbance: Secondary | ICD-10-CM | POA: Diagnosis not present

## 2017-04-20 DIAGNOSIS — G894 Chronic pain syndrome: Secondary | ICD-10-CM | POA: Diagnosis not present

## 2017-04-22 DIAGNOSIS — Z23 Encounter for immunization: Secondary | ICD-10-CM | POA: Diagnosis not present

## 2017-04-23 DIAGNOSIS — Z9181 History of falling: Secondary | ICD-10-CM | POA: Diagnosis not present

## 2017-04-23 DIAGNOSIS — F0280 Dementia in other diseases classified elsewhere without behavioral disturbance: Secondary | ICD-10-CM | POA: Diagnosis not present

## 2017-04-23 DIAGNOSIS — M48061 Spinal stenosis, lumbar region without neurogenic claudication: Secondary | ICD-10-CM | POA: Diagnosis not present

## 2017-04-23 DIAGNOSIS — G894 Chronic pain syndrome: Secondary | ICD-10-CM | POA: Diagnosis not present

## 2017-04-23 DIAGNOSIS — G20 Parkinson's disease: Secondary | ICD-10-CM | POA: Diagnosis not present

## 2017-04-25 DIAGNOSIS — G20 Parkinson's disease: Secondary | ICD-10-CM | POA: Diagnosis not present

## 2017-04-25 DIAGNOSIS — F0280 Dementia in other diseases classified elsewhere without behavioral disturbance: Secondary | ICD-10-CM | POA: Diagnosis not present

## 2017-04-25 DIAGNOSIS — Z9181 History of falling: Secondary | ICD-10-CM | POA: Diagnosis not present

## 2017-04-25 DIAGNOSIS — G894 Chronic pain syndrome: Secondary | ICD-10-CM | POA: Diagnosis not present

## 2017-04-25 DIAGNOSIS — M48061 Spinal stenosis, lumbar region without neurogenic claudication: Secondary | ICD-10-CM | POA: Diagnosis not present

## 2017-04-26 DIAGNOSIS — F0280 Dementia in other diseases classified elsewhere without behavioral disturbance: Secondary | ICD-10-CM | POA: Diagnosis not present

## 2017-04-26 DIAGNOSIS — Z9181 History of falling: Secondary | ICD-10-CM | POA: Diagnosis not present

## 2017-04-26 DIAGNOSIS — M48061 Spinal stenosis, lumbar region without neurogenic claudication: Secondary | ICD-10-CM | POA: Diagnosis not present

## 2017-04-26 DIAGNOSIS — G20 Parkinson's disease: Secondary | ICD-10-CM | POA: Diagnosis not present

## 2017-04-26 DIAGNOSIS — G894 Chronic pain syndrome: Secondary | ICD-10-CM | POA: Diagnosis not present

## 2017-04-30 DIAGNOSIS — M48061 Spinal stenosis, lumbar region without neurogenic claudication: Secondary | ICD-10-CM | POA: Diagnosis not present

## 2017-04-30 DIAGNOSIS — G20 Parkinson's disease: Secondary | ICD-10-CM | POA: Diagnosis not present

## 2017-04-30 DIAGNOSIS — F0280 Dementia in other diseases classified elsewhere without behavioral disturbance: Secondary | ICD-10-CM | POA: Diagnosis not present

## 2017-04-30 DIAGNOSIS — G894 Chronic pain syndrome: Secondary | ICD-10-CM | POA: Diagnosis not present

## 2017-04-30 DIAGNOSIS — Z9181 History of falling: Secondary | ICD-10-CM | POA: Diagnosis not present

## 2017-05-02 DIAGNOSIS — G894 Chronic pain syndrome: Secondary | ICD-10-CM | POA: Diagnosis not present

## 2017-05-02 DIAGNOSIS — G20 Parkinson's disease: Secondary | ICD-10-CM | POA: Diagnosis not present

## 2017-05-02 DIAGNOSIS — Z9181 History of falling: Secondary | ICD-10-CM | POA: Diagnosis not present

## 2017-05-02 DIAGNOSIS — M48061 Spinal stenosis, lumbar region without neurogenic claudication: Secondary | ICD-10-CM | POA: Diagnosis not present

## 2017-05-02 DIAGNOSIS — F0280 Dementia in other diseases classified elsewhere without behavioral disturbance: Secondary | ICD-10-CM | POA: Diagnosis not present

## 2017-05-07 DIAGNOSIS — M48061 Spinal stenosis, lumbar region without neurogenic claudication: Secondary | ICD-10-CM | POA: Diagnosis not present

## 2017-05-07 DIAGNOSIS — G20 Parkinson's disease: Secondary | ICD-10-CM | POA: Diagnosis not present

## 2017-05-07 DIAGNOSIS — G894 Chronic pain syndrome: Secondary | ICD-10-CM | POA: Diagnosis not present

## 2017-05-07 DIAGNOSIS — Z9181 History of falling: Secondary | ICD-10-CM | POA: Diagnosis not present

## 2017-05-07 DIAGNOSIS — F0280 Dementia in other diseases classified elsewhere without behavioral disturbance: Secondary | ICD-10-CM | POA: Diagnosis not present

## 2017-05-09 DIAGNOSIS — Z9181 History of falling: Secondary | ICD-10-CM | POA: Diagnosis not present

## 2017-05-09 DIAGNOSIS — M48061 Spinal stenosis, lumbar region without neurogenic claudication: Secondary | ICD-10-CM | POA: Diagnosis not present

## 2017-05-09 DIAGNOSIS — G20 Parkinson's disease: Secondary | ICD-10-CM | POA: Diagnosis not present

## 2017-05-09 DIAGNOSIS — F0280 Dementia in other diseases classified elsewhere without behavioral disturbance: Secondary | ICD-10-CM | POA: Diagnosis not present

## 2017-05-09 DIAGNOSIS — G894 Chronic pain syndrome: Secondary | ICD-10-CM | POA: Diagnosis not present

## 2017-05-16 DIAGNOSIS — G20 Parkinson's disease: Secondary | ICD-10-CM | POA: Diagnosis not present

## 2017-05-16 DIAGNOSIS — M48061 Spinal stenosis, lumbar region without neurogenic claudication: Secondary | ICD-10-CM | POA: Diagnosis not present

## 2017-05-16 DIAGNOSIS — G894 Chronic pain syndrome: Secondary | ICD-10-CM | POA: Diagnosis not present

## 2017-05-16 DIAGNOSIS — Z9181 History of falling: Secondary | ICD-10-CM | POA: Diagnosis not present

## 2017-05-16 DIAGNOSIS — F0280 Dementia in other diseases classified elsewhere without behavioral disturbance: Secondary | ICD-10-CM | POA: Diagnosis not present

## 2017-05-22 DIAGNOSIS — F0280 Dementia in other diseases classified elsewhere without behavioral disturbance: Secondary | ICD-10-CM | POA: Diagnosis not present

## 2017-05-22 DIAGNOSIS — G894 Chronic pain syndrome: Secondary | ICD-10-CM | POA: Diagnosis not present

## 2017-05-22 DIAGNOSIS — M48061 Spinal stenosis, lumbar region without neurogenic claudication: Secondary | ICD-10-CM | POA: Diagnosis not present

## 2017-05-22 DIAGNOSIS — Z9181 History of falling: Secondary | ICD-10-CM | POA: Diagnosis not present

## 2017-05-22 DIAGNOSIS — G20 Parkinson's disease: Secondary | ICD-10-CM | POA: Diagnosis not present

## 2017-05-28 DIAGNOSIS — M48061 Spinal stenosis, lumbar region without neurogenic claudication: Secondary | ICD-10-CM | POA: Diagnosis not present

## 2017-05-28 DIAGNOSIS — F0280 Dementia in other diseases classified elsewhere without behavioral disturbance: Secondary | ICD-10-CM | POA: Diagnosis not present

## 2017-05-28 DIAGNOSIS — Z9181 History of falling: Secondary | ICD-10-CM | POA: Diagnosis not present

## 2017-05-28 DIAGNOSIS — G894 Chronic pain syndrome: Secondary | ICD-10-CM | POA: Diagnosis not present

## 2017-05-28 DIAGNOSIS — G20 Parkinson's disease: Secondary | ICD-10-CM | POA: Diagnosis not present

## 2017-06-12 DIAGNOSIS — H47323 Drusen of optic disc, bilateral: Secondary | ICD-10-CM | POA: Diagnosis not present

## 2017-06-12 DIAGNOSIS — H40023 Open angle with borderline findings, high risk, bilateral: Secondary | ICD-10-CM | POA: Diagnosis not present

## 2017-06-22 ENCOUNTER — Emergency Department (HOSPITAL_COMMUNITY)
Admission: EM | Admit: 2017-06-22 | Discharge: 2017-06-22 | Disposition: A | Discharge status: Home / Self Care | Payer: Medicare Other | Attending: Emergency Medicine | Admitting: Emergency Medicine

## 2017-06-22 ENCOUNTER — Emergency Department (HOSPITAL_COMMUNITY): Payer: Medicare Other

## 2017-06-22 DIAGNOSIS — R030 Elevated blood-pressure reading, without diagnosis of hypertension: Secondary | ICD-10-CM | POA: Diagnosis not present

## 2017-06-22 DIAGNOSIS — M549 Dorsalgia, unspecified: Secondary | ICD-10-CM | POA: Diagnosis present

## 2017-06-22 DIAGNOSIS — R300 Dysuria: Secondary | ICD-10-CM | POA: Diagnosis not present

## 2017-06-22 DIAGNOSIS — F0390 Unspecified dementia without behavioral disturbance: Secondary | ICD-10-CM | POA: Insufficient documentation

## 2017-06-22 DIAGNOSIS — Z87891 Personal history of nicotine dependence: Secondary | ICD-10-CM | POA: Diagnosis not present

## 2017-06-22 DIAGNOSIS — Z79899 Other long term (current) drug therapy: Secondary | ICD-10-CM | POA: Diagnosis not present

## 2017-06-22 DIAGNOSIS — S0990XA Unspecified injury of head, initial encounter: Secondary | ICD-10-CM | POA: Diagnosis not present

## 2017-06-22 DIAGNOSIS — M545 Low back pain: Secondary | ICD-10-CM | POA: Diagnosis not present

## 2017-06-22 DIAGNOSIS — W19XXXA Unspecified fall, initial encounter: Secondary | ICD-10-CM

## 2017-06-22 DIAGNOSIS — Z043 Encounter for examination and observation following other accident: Secondary | ICD-10-CM | POA: Diagnosis not present

## 2017-06-22 LAB — URINALYSIS, ROUTINE W REFLEX MICROSCOPIC
Bilirubin Urine: NEGATIVE
GLUCOSE, UA: NEGATIVE mg/dL
Hgb urine dipstick: NEGATIVE
Ketones, ur: 20 mg/dL — AB
NITRITE: NEGATIVE
PH: 5 (ref 5.0–8.0)
Protein, ur: 30 mg/dL — AB
Specific Gravity, Urine: 1.032 — ABNORMAL HIGH (ref 1.005–1.030)

## 2017-06-22 LAB — BASIC METABOLIC PANEL
ANION GAP: 7 (ref 5–15)
BUN: 21 mg/dL — ABNORMAL HIGH (ref 6–20)
CALCIUM: 9.6 mg/dL (ref 8.9–10.3)
CO2: 30 mmol/L (ref 22–32)
Chloride: 100 mmol/L — ABNORMAL LOW (ref 101–111)
Creatinine, Ser: 1 mg/dL (ref 0.61–1.24)
Glucose, Bld: 80 mg/dL (ref 65–99)
Potassium: 4.2 mmol/L (ref 3.5–5.1)
SODIUM: 137 mmol/L (ref 135–145)

## 2017-06-22 LAB — CBC WITH DIFFERENTIAL/PLATELET
BASOS ABS: 0 10*3/uL (ref 0.0–0.1)
BASOS PCT: 0 %
EOS ABS: 0.1 10*3/uL (ref 0.0–0.7)
Eosinophils Relative: 2 %
HEMATOCRIT: 35.2 % — AB (ref 39.0–52.0)
HEMOGLOBIN: 11.5 g/dL — AB (ref 13.0–17.0)
Lymphocytes Relative: 25 %
Lymphs Abs: 1.4 10*3/uL (ref 0.7–4.0)
MCH: 25.7 pg — ABNORMAL LOW (ref 26.0–34.0)
MCHC: 32.7 g/dL (ref 30.0–36.0)
MCV: 78.6 fL (ref 78.0–100.0)
MONOS PCT: 8 %
Monocytes Absolute: 0.5 10*3/uL (ref 0.1–1.0)
NEUTROS ABS: 3.6 10*3/uL (ref 1.7–7.7)
NEUTROS PCT: 65 %
Platelets: 225 10*3/uL (ref 150–400)
RBC: 4.48 MIL/uL (ref 4.22–5.81)
RDW: 14.3 % (ref 11.5–15.5)
WBC: 5.6 10*3/uL (ref 4.0–10.5)

## 2017-06-22 NOTE — ED Notes (Signed)
Bed: [id number] Expected date: 06/22/17 Expected time: 1:34 PM Means of arrival: Ambulance Comments: Fall, Payton EmeraldParkinson

## 2017-06-22 NOTE — ED Provider Notes (Signed)
[facility]-EMERGENCY DEPT Provider Note   CSN: [id number] Arrival date & time: 06/22/17  1333     History   Chief Complaint Chief Complaint  Patient presents with  . Fall    HPI Bradley Fleming is a 71 y.o. male with a past medical history of Parkinson's disease, camptocormia, presents to ED for evaluation of back pain after fall prior to arrival.  He is unable to give me the remainder of the HPI or tell me why he fell but he is complaining of back pain and dysuria.  He denies any other pain at this time.  HPI  Past Medical History:  Diagnosis Date  . Abnormality of gait 07/11/2015  . Camptocormia   . Focal dystonia   . Memory disorder 11/09/2015  . Parkinson disease [facility])     Patient Active Problem List   Diagnosis Date Noted  . Syncope 10/15/2016  . Dementia due to Parkinson's disease without behavioral disturbance (HCC)   . Encephalopathy acute 09/21/2016  . Memory disorder 11/09/2015  . Parkinson disease (HCC) 07/11/2015    Past Surgical History:  Procedure Laterality Date  . ANKLE SURGERY Right   . CATARACT EXTRACTION         Home Medications    Prior to Admission medications   Medication Sig Start Date End Date Taking? Authorizing Provider  carbidopa-levodopa (SINEMET CR) 50-200 MG tablet Take 1 tablet by mouth 3 (three) times daily. 03/15/16   [name], MD  ENSURE (ENSURE) Take 237 mLs by mouth 2 (two) times daily between meals.    [provider]  gabapentin (NEURONTIN) 300 MG capsule Take 1 capsule (300 mg total) by mouth 3 (three) times daily. 03/27/16   [name], MD  memantine (NAMENDA) 10 MG tablet Take 10 mg by mouth 2 (two) times daily. 09/03/16 09/03/17  [provider]  mirabegron ER (MYRBETRIQ) 25 MG TB24 tablet Take 25 mg by mouth daily.    [provider]  PARoxetine (PAXIL) 40 MG tablet Take 40 mg by mouth daily with breakfast.    [provider]  selegiline (ELDEPRYL) 5  MG tablet Take 5 mg by mouth 2 (two) times daily. 07/20/16   [provider]    Family History Family History  Problem Relation Age of Onset  . Heart failure Mother   . Cirrhosis Father   . Alcohol abuse Father   . Tremor Sister   . COPD Brother   . Diabetes Brother   . Heart failure Brother     Social History Social History   Tobacco Use  . Smoking status: Former Games developer  . Smokeless tobacco: Never Used  Substance Use Topics  . Alcohol use: No    Alcohol/week: 0.0 oz    Comment: h/o social use  . Drug use: No    Comment: remote h/o use - maybe 10 years ago     Allergies   Banana   Review of Systems Review of Systems  Unable to perform ROS: Dementia  Eyes: Negative for visual disturbance.  Genitourinary: Positive for dysuria.  Musculoskeletal: Positive for back pain.  Neurological: Negative for headaches.     Physical Exam Updated Vital Signs BP (!) 148/91 (BP Location: Right Arm)   Pulse 84   Temp 97.7 F (36.5 C) (Oral)   Resp 19   SpO2 97%   Physical Exam  Constitutional: He appears well-developed and well-nourished. No distress.  Nontoxic appearing and in no acute distress.  Does  appear older than stated age.  HENT:  Head: Normocephalic and atraumatic.  Nose: Nose normal.  Eyes: Conjunctivae and EOM are normal. Right eye exhibits no discharge. Left eye exhibits no discharge. No scleral icterus.  Neck: Normal range of motion. Neck supple.  Cardiovascular: Normal rate, regular rhythm, normal heart sounds and intact distal pulses. Exam reveals no gallop and no friction rub.  No murmur heard. Pulmonary/Chest: Effort normal and breath sounds normal. No respiratory distress.  No tenderness to palpation of the ribs.  Abdominal: Soft. Bowel sounds are normal. He exhibits no distension. There is no tenderness. There is no guarding.  Musculoskeletal: Normal range of motion. He exhibits no edema, tenderness or deformity.  Unable to assess tenderness  of spine because patient did not want to sit up.  Neurological: He is alert. No cranial nerve deficit or sensory deficit. He exhibits normal muscle tone. Coordination normal.  Pupils reactive. No facial asymmetry noted. Cranial nerves appear grossly intact. Sensation intact to light touch on face, BUE and BLE. Strength 5/5 in BUE and BLE. Patient is alert and oriented to his own name, date of birth, his wife's name and the fact that [place].  Unable to tell me month, date, day of week, city or situation.  Skin: Skin is warm and dry. No rash noted.  Psychiatric: He has a normal mood and affect.  Nursing note and vitals reviewed.    ED Treatments / Results  Labs (all labs ordered are listed, but only abnormal results are displayed) Labs Reviewed  BASIC METABOLIC PANEL - Abnormal; Notable for the following components:      Result Value   Chloride 100 (*)    BUN 21 (*)    All other components within normal limits  CBC WITH DIFFERENTIAL/PLATELET - Abnormal; Notable for the following components:   Hemoglobin 11.5 (*)    HCT 35.2 (*)    MCH 25.7 (*)    All other components within normal limits  URINALYSIS, ROUTINE W REFLEX MICROSCOPIC - Abnormal; Notable for the following components:   APPearance HAZY (*)    Specific Gravity, Urine 1.032 (*)    Ketones, ur 20 (*)    Protein, ur 30 (*)    Leukocytes, UA TRACE (*)    Bacteria, UA RARE (*)    Squamous Epithelial / LPF 0-5 (*)    All other components within normal limits  URINE CULTURE    EKG  EKG Interpretation None       Radiology Dg Lumbar Spine Complete  Result Date: 06/22/2017 CLINICAL DATA:  Fall and generalized low back pain. EXAM: LUMBAR SPINE - COMPLETE 4+ VIEW COMPARISON:  08/17/2016 FINDINGS: Minimal anterolisthesis at L4-L5 is unchanged. The vertebral body heights are maintained without a compression deformity or fracture. No significant disc space narrowing. No evidence for pars defect. IMPRESSION: No  acute bone abnormality in lumbar spine. Stable minimal anterolisthesis at L4-L5. Electronically Signed   By: Richarda OverlieAdam  Henn M.D.   On: 06/22/2017 14:44   Ct Head Wo Contrast  Result Date: 06/22/2017 CLINICAL DATA:  71 year old male his post fall EXAM: CT HEAD WITHOUT CONTRAST TECHNIQUE: Contiguous axial images were obtained from the base of the skull through the vertex without intravenous contrast. COMPARISON:  10/15/2016 FINDINGS: Brain: No evidence of acute infarction, hemorrhage, hydrocephalus, extra-axial collection or mass lesion/mass effect. Note is again made of mild to moderate cortical volume loss with periventricular white matter changes likely representing small vessel ischemic disease. Chronic ischemic changes are  noted at the bilateral basal ganglia. Vascular: No hyperdense vessel or unexpected calcification. Skull: Normal. Negative for fracture or focal lesion. Sinuses/Orbits: Visualized paranasal sinuses and mastoid air cells are clear. Orbits are symmetric and intact. Other: None. IMPRESSION: 1. No CT evidence for acute intracranial pathology. 2. Stable mild to moderate cortical volume loss, periventricular white matter changes and chronic ischemic changes of the basal ganglia. Electronically Signed   By: Sande Brothers M.D.   On: 06/22/2017 18:32    Procedures Procedures (including critical care time)  Medications Ordered in ED Medications - No data to display   Initial Impression / Assessment and Plan / ED Course  I have reviewed the triage vital signs and the nursing notes.  Pertinent labs & imaging results that were available during my care of the patient were reviewed by me and considered in my medical decision making (see chart for details).     Patient presents to ED for evaluation of Parkinson's disease, presents to ED for fall prior to arrival.  He does have a history of memory changes and possible dementia so he is not able to give a more detailed history.  He is  complaining of back pain and dysuria.  He denies any other pain including headache, neck pain, numbness in legs or abdominal pain.  On physical exam there are no deficits on neurological exam that would warrant a CT of the head. Patient is not on blood thinners.  There is no tenderness to palpation of the ribs or the abdomen, or C-spine.  He is alert and oriented to self and his wife's name.  He states that his wife is on the way however she was not here for the duration of his evaluation.  Lab work including CBC, BMP is unremarkable.  Urinalysis will be sent for culture for possible UTI.  X-ray of lumbar spine returned as unremarkable.  Will advise patient to follow-up with his primary care provider for further evaluation.  Patient appears stable for discharge at this time.  Strict return precautions given. Patient discussed with and seen by Dr. Effie Shy.  Patient's wife arrived to pick up patient approximately 1 hour after patient was discharged.  She was concerned that the patient did not receive a CT of his head and she was concerned that his disease is getting worse.  I did inform her that there were no deficits on his neurological exam which is why we did not elect to get the CT of his head done.  She insists that "he has been having so many falls I just want to make sure everything is okay."  CT of head was done which showed no acute pathology.  I informed wife of the findings and advised her to follow-up with his PCP and specialist for further evaluation.  Patient appears stable for discharge at this time.  Strict return precautions given.  Final Clinical Impressions(s) / ED Diagnoses   Final diagnoses:  Fall, initial encounter    ED Discharge Orders    None         Dietrich Pates, PA-C 06/22/17 1843    Mancel Bale, MD 06/23/17 [phone]

## 2017-06-22 NOTE — Discharge Instructions (Signed)
Follow-up with your primary care provider for further evaluation. Continue your home medications as previously prescribed. Return to ED for additional injuries or falls, severe back pain, head injuries, vision changes or loss of consciousness.

## 2017-06-22 NOTE — ED Triage Notes (Signed)
Transported by [facility] from home due to fall. Wife told EMS that patient fell this morning trying to get out of bed. Hx of dementia and parkinson's disease. Alert and oriented x 3. 2nd fall within the last week. Unwitnessed fall, patient denies any pain or LOC.

## 2017-06-22 NOTE — ED Notes (Signed)
Wife arrived to pick patient and was very concerned considering that after plan of care was reviewed, no CT scan was ordered. Charge nurse (Patty) and EDP (Hina) aware of situation. Decision was made to return patient back to an assigned room and complete a CT head scan. Wife with patient at bedside. No acute changes noted in patient condition. Will continue to monitor. Call bell within reach.

## 2017-06-22 NOTE — ED Provider Notes (Signed)
  Face-to-face evaluation   History: Presents for evaluation of back pain after fall.  He is unable to give history secondary to dementia.  Physical exam: Alert, elderly man.  He is confused.  Ribs nontender to palpation.  Extremities no deformity.  Medical screening examination/treatment/procedure(s) were conducted as a shared visit with non-physician practitioner(s) and myself.  I personally evaluated the patient during the encounter    Mancel BaleWentz, Bradley Fitzhenry, MD 06/23/17 [id number]

## 2017-06-22 NOTE — ED Notes (Signed)
Patient discharged with wife at this time. New discharge paperwork presented to wife and patient. No acute distress noted.

## 2017-06-22 NOTE — ED Notes (Signed)
Patient transported to CT 

## 2017-06-22 NOTE — ED Notes (Signed)
Attempted to call [facility] for patient but address listed is a PO box. Attempted to call patient's wife twice.

## 2017-06-22 NOTE — ED Notes (Signed)
Bed: WA22 Expected date:  Expected time:  Means of arrival:  Comments: 

## 2017-06-26 LAB — URINE CULTURE: Culture: NO GROWTH

## 2017-06-30 ENCOUNTER — Emergency Department (HOSPITAL_COMMUNITY)
Admission: EM | Admit: 2017-06-30 | Discharge: 2017-07-01 | Disposition: A | Discharge status: Home / Self Care | Payer: Medicare Other | Attending: Emergency Medicine | Admitting: Emergency Medicine

## 2017-06-30 ENCOUNTER — Emergency Department (HOSPITAL_COMMUNITY): Payer: Medicare Other

## 2017-06-30 ENCOUNTER — Encounter (HOSPITAL_COMMUNITY): Payer: Self-pay | Admitting: Emergency Medicine

## 2017-06-30 DIAGNOSIS — R531 Weakness: Secondary | ICD-10-CM | POA: Diagnosis present

## 2017-06-30 DIAGNOSIS — Z79899 Other long term (current) drug therapy: Secondary | ICD-10-CM | POA: Diagnosis not present

## 2017-06-30 DIAGNOSIS — F0390 Unspecified dementia without behavioral disturbance: Secondary | ICD-10-CM | POA: Diagnosis not present

## 2017-06-30 DIAGNOSIS — I6789 Other cerebrovascular disease: Secondary | ICD-10-CM | POA: Diagnosis not present

## 2017-06-30 DIAGNOSIS — R4020 Unspecified coma: Secondary | ICD-10-CM | POA: Diagnosis not present

## 2017-06-30 DIAGNOSIS — Z87891 Personal history of nicotine dependence: Secondary | ICD-10-CM | POA: Insufficient documentation

## 2017-06-30 DIAGNOSIS — G20 Parkinson's disease: Secondary | ICD-10-CM | POA: Diagnosis not present

## 2017-06-30 LAB — CBC WITH DIFFERENTIAL/PLATELET
BASOS ABS: 0 10*3/uL (ref 0.0–0.1)
BASOS PCT: 0 %
Eosinophils Absolute: 0.1 10*3/uL (ref 0.0–0.7)
Eosinophils Relative: 2 %
HEMATOCRIT: 37.1 % — AB (ref 39.0–52.0)
HEMOGLOBIN: 11.7 g/dL — AB (ref 13.0–17.0)
LYMPHS PCT: 25 %
Lymphs Abs: 1.4 10*3/uL (ref 0.7–4.0)
MCH: 25.1 pg — ABNORMAL LOW (ref 26.0–34.0)
MCHC: 31.5 g/dL (ref 30.0–36.0)
MCV: 79.6 fL (ref 78.0–100.0)
Monocytes Absolute: 0.4 10*3/uL (ref 0.1–1.0)
Monocytes Relative: 7 %
NEUTROS ABS: 3.5 10*3/uL (ref 1.7–7.7)
NEUTROS PCT: 66 %
Platelets: 229 10*3/uL (ref 150–400)
RBC: 4.66 MIL/uL (ref 4.22–5.81)
RDW: 14.6 % (ref 11.5–15.5)
WBC: 5.4 10*3/uL (ref 4.0–10.5)

## 2017-06-30 LAB — URINALYSIS, ROUTINE W REFLEX MICROSCOPIC
Bilirubin Urine: NEGATIVE
Glucose, UA: NEGATIVE mg/dL
HGB URINE DIPSTICK: NEGATIVE
Ketones, ur: NEGATIVE mg/dL
Leukocytes, UA: NEGATIVE
NITRITE: NEGATIVE
Protein, ur: NEGATIVE mg/dL
SPECIFIC GRAVITY, URINE: 1.024 (ref 1.005–1.030)
pH: 7 (ref 5.0–8.0)

## 2017-06-30 LAB — COMPREHENSIVE METABOLIC PANEL
ALT: 32 U/L (ref 17–63)
AST: 26 U/L (ref 15–41)
Albumin: 3.7 g/dL (ref 3.5–5.0)
Alkaline Phosphatase: 99 U/L (ref 38–126)
Anion gap: 5 (ref 5–15)
BUN: 20 mg/dL (ref 6–20)
CHLORIDE: 107 mmol/L (ref 101–111)
CO2: 29 mmol/L (ref 22–32)
Calcium: 9.3 mg/dL (ref 8.9–10.3)
Creatinine, Ser: 0.74 mg/dL (ref 0.61–1.24)
GFR calc Af Amer: >60 mL/min (ref >60)
Glucose, Bld: 89 mg/dL (ref 65–99)
POTASSIUM: 4.6 mmol/L (ref 3.5–5.1)
SODIUM: 141 mmol/L (ref 135–145)
Total Bilirubin: 0.4 mg/dL (ref 0.3–1.2)
Total Protein: 7.1 g/dL (ref 6.5–8.1)

## 2017-06-30 NOTE — ED Notes (Addendum)
Unsuccessful IV attempt x2.  

## 2017-06-30 NOTE — ED Triage Notes (Addendum)
Per EMS, patient coming from home, c/o generalized weakness worsening x2 days. Unable to get out of bed today. Hx parkinson's. Wife reports patient has been altered since last night, asking about deceased mother. Denies N/V/D.   BP 146/83 RR 16 HR 83 O2 99%  CBG 102

## 2017-06-30 NOTE — ED Provider Notes (Signed)
[facility]-EMERGENCY DEPT Provider Note   CSN: [id number] Arrival date & time: 06/30/17  1850     History   Chief Complaint Chief Complaint  Patient presents with  . Weakness    HPI Ferd GlassingGary Rally Ouch Haverstock is a 71 y.o. male.  HPI Level 5 caveat.  Patient coming from home by EMS with generalized weakness for the past 2 days.  Unable to get out of bed.  No family members at bedside. Past Medical History:  Diagnosis Date  . Abnormality of gait 07/11/2015  . Camptocormia   . Focal dystonia   . Memory disorder 11/09/2015  . Parkinson disease [facility](HCC)     Patient Active Problem List   Diagnosis Date Noted  . Syncope 10/15/2016  . Dementia due to Parkinson's disease without behavioral disturbance (HCC)   . Encephalopathy acute 09/21/2016  . Memory disorder 11/09/2015  . Parkinson disease (HCC) 07/11/2015    Past Surgical History:  Procedure Laterality Date  . ANKLE SURGERY Right   . CATARACT EXTRACTION         Home Medications    Prior to Admission medications   Medication Sig Start Date End Date Taking? Authorizing Provider  carbidopa-levodopa (SINEMET IR) 25-100 MG tablet Take 1.5 tablets by mouth 3 (three) times daily.   Yes [provider]  Cholecalciferol (VITAMIN D3) 1000 units CAPS Take 2,500 Units by mouth daily.   Yes [provider]  donepezil (ARICEPT) 10 MG tablet Take 10 mg by mouth at bedtime.   Yes [provider]  ENSURE (ENSURE) Take 237 mLs by mouth 2 (two) times daily between meals.   Yes [provider]  gabapentin (NEURONTIN) 300 MG capsule Take 1 capsule (300 mg total) by mouth 3 (three) times daily. 03/27/16  Yes York SpanielWillis, Charles K, MD  GARLIC PO Take 1 tablet by mouth daily.   Yes [provider]  memantine (NAMENDA) 10 MG tablet Take 10 mg by mouth 2 (two) times daily. 09/03/16 09/03/17 Yes [provider]  Multiple Vitamins-Minerals (MULTIVITAMIN ADULT PO) Take 1 tablet by mouth  daily.   Yes [provider]  PARoxetine (PAXIL) 40 MG tablet Take 40 mg by mouth daily with breakfast.   Yes [provider]  selegiline (ELDEPRYL) 5 MG tablet Take 5 mg by mouth 2 (two) times daily. 07/20/16  Yes [provider]  carbidopa-levodopa (SINEMET CR) 50-200 MG tablet Take 1 tablet by mouth 3 (three) times daily. Patient not taking: Reported on 06/30/2017 03/15/16   York SpanielWillis, Charles K, MD    Family History Family History  Problem Relation Age of Onset  . Heart failure Mother   . Cirrhosis Father   . Alcohol abuse Father   . Tremor Sister   . COPD Brother   . Diabetes Brother   . Heart failure Brother     Social History Social History   Tobacco Use  . Smoking status: Former Games developermoker  . Smokeless tobacco: Never Used  Substance Use Topics  . Alcohol use: No    Alcohol/week: 0.0 oz    Comment: h/o social use  . Drug use: No    Comment: remote h/o use - maybe 10 years ago     Allergies   Banana   Review of Systems Review of Systems  Unable to perform ROS: Dementia     Physical Exam Updated Vital Signs BP (!) 146/80 (BP Location: Right Arm)   Pulse 97   Temp 98.6 F (37 C) (Oral)  Resp (!) 22   SpO2 99%   Physical Exam  Constitutional: He appears well-developed and well-nourished. No distress.  HENT:  Head: Normocephalic and atraumatic.  Dry mucous membranes  Eyes: EOM are normal. Pupils are equal, round, and reactive to light.  Pinpoint pupils bilaterally.  Neck: Normal range of motion. Neck supple.  Cardiovascular: Normal rate and regular rhythm. Exam reveals no gallop and no friction rub.  No murmur heard. Pulmonary/Chest: Effort normal and breath sounds normal. No stridor. No respiratory distress. He has no wheezes. He has no rales. He exhibits no tenderness.  Abdominal: Soft. Bowel sounds are normal. There is no tenderness. There is no rebound and no guarding.  Musculoskeletal: Normal range of motion. He exhibits no  edema or tenderness.  No lower extremity swelling, asymmetry or tenderness.  Distal pulses are 2+.  Neurological:  Will follow simple commands.  Appears to be moving all extremities though generally weak.  Skin: Skin is warm and dry. Capillary refill takes less than 2 seconds. No rash noted. He is not diaphoretic. No erythema.  Psychiatric: He has a normal mood and affect. His behavior is normal.  Nursing note and vitals reviewed.    ED Treatments / Results  Labs (all labs ordered are listed, but only abnormal results are displayed) Labs Reviewed  CBC WITH DIFFERENTIAL/PLATELET - Abnormal; Notable for the following components:      Result Value   Hemoglobin 11.7 (*)    HCT 37.1 (*)    MCH 25.1 (*)    All other components within normal limits  COMPREHENSIVE METABOLIC PANEL  URINALYSIS, ROUTINE W REFLEX MICROSCOPIC    EKG  EKG Interpretation None       Radiology No results found.  Procedures Procedures (including critical care time)  Medications Ordered in ED Medications - No data to display   Initial Impression / Assessment and Plan / ED Course  I have reviewed the triage vital signs and the nursing notes.  Pertinent labs & imaging results that were available during my care of the patient were reviewed by me and considered in my medical decision making (see chart for details).     No acute findings to explain the patient's symptoms.  Patient's wife is unable to take care of patient at home.  Will consult social work for possible placement.  Final Clinical Impressions(s) / ED Diagnoses   Final diagnoses:  Dementia without behavioral disturbance, unspecified dementia type    ED Discharge Orders        Ordered    Home Health     07/01/17 [phone] [id number]    Face-to-face encounter (required for Medicare/Medicaid patients)    Comments:  I Wynetta Fineseter C Messick certify that this patient is under my care and that I, or a nurse practitioner or physician's assistant working with me,  had a face-to-face encounter that meets the physician face-to-face encounter requirements with this patient on 07/01/2017. The encounter with the patient was in whole, or in part for the following medical condition(s) which is the primary reason for home health care (List medical condition): Dementia, Weak and Deconditioned   07/01/17 [id number]       Loren RacerYelverton, Jailen Coward, MD 07/03/17 [phone]

## 2017-06-30 NOTE — ED Notes (Signed)
Bed: WA08 Expected date:  Expected time:  Means of arrival:  Comments: 71 yo AMS

## 2017-06-30 NOTE — ED Notes (Signed)
Elease Hashimotoatricia, wife of patient phone number [phone]

## 2017-07-01 NOTE — ED Provider Notes (Signed)
Patient with dementia and profound weakness.  Screening workup in the ED did not reveal acute pathology.  Social work and case management have evaluated the patient.  Patient does not meet criteria for inpatient care.  Patient to be discharged home to the care of his wife.  Arrangements made for home health evaluation.   Wynetta FinesMessick, Bekki Tavenner C, MD 07/01/17 [phone]

## 2017-07-01 NOTE — ED Notes (Signed)
Unable to locate patient's sweatshirt or hat.  Checked all lockers and area in main ED where patient was located.

## 2017-07-01 NOTE — ED Notes (Signed)
Spouse here to pick up patient.  She wants to make sure he can still walk with walker before she takes him home because she will be unable to get him into the house.  Nurse assured wife that [facility] was available for transport home if patient could not walk.

## 2017-07-01 NOTE — Progress Notes (Signed)
CSW spoke with doctor for clarification about consult. EDP stated consult was likely for SNF placement. Explained to EDP that SNF placement would not be covered by insurance at this time. EDP informed social worker that home health care could be appropriate at this time. Case Manager consult put in by EDP.   CSW spoke with wife Elease Hashimotoatricia at [phone]. Elease Hashimotoatricia informed CSW that the patient can come home. Wife stated she would like to look into having Home Health Care, again. Pt had home health care through [facility], but it stopped recently. Wife stated she would be here to pick up the patient within the hour.   CSW informed Case Manager of the updates. CSW signing off, please re-consult if any other social work needs arise.   Montine CircleKelsy Takari Lundahl, Silverio LayLCSWA [facility]  [phone]

## 2017-07-01 NOTE — Care Management Note (Signed)
Case Management Note  Patient Details  Name: Bradley Fleming MRN: [id number] Date of Birth: 09/18/1945  Subjective/Objective:                   71 y.o. male from home by EMS with generalized weakness for the past 2 days.  Action/Plan: CM consulted for HHS.  Pt chose to use [facility] again.  [facility]  Expected Discharge Date:   07/01/2017               Expected Discharge Plan:  Home w Home Health Services  Discharge planning Services  CM Consult  Post Acute Care Choice:  Home Health Choice offered to:  Patient  HH Arranged:  RN, PT, OT, Nurse's Aide, Social Work [facility]  Status of Service:   Completed, signed off.  Bradley Fleming, Bradley SandhoffAngela N, RN 07/01/2017, 10:47 AM

## 2017-07-01 NOTE — ED Notes (Signed)
Bed: [id number] Expected date:  Expected time:  Means of arrival:  Comments: Room 8

## 2017-07-02 DIAGNOSIS — F0280 Dementia in other diseases classified elsewhere without behavioral disturbance: Secondary | ICD-10-CM | POA: Diagnosis not present

## 2017-07-02 DIAGNOSIS — G20 Parkinson's disease: Secondary | ICD-10-CM | POA: Diagnosis not present

## 2017-07-02 DIAGNOSIS — Z9181 History of falling: Secondary | ICD-10-CM | POA: Diagnosis not present

## 2017-07-04 DIAGNOSIS — G20 Parkinson's disease: Secondary | ICD-10-CM | POA: Diagnosis not present

## 2017-07-04 DIAGNOSIS — Z9181 History of falling: Secondary | ICD-10-CM | POA: Diagnosis not present

## 2017-07-04 DIAGNOSIS — F0280 Dementia in other diseases classified elsewhere without behavioral disturbance: Secondary | ICD-10-CM | POA: Diagnosis not present

## 2017-07-11 DIAGNOSIS — G20 Parkinson's disease: Secondary | ICD-10-CM | POA: Diagnosis not present

## 2017-07-11 DIAGNOSIS — Z9181 History of falling: Secondary | ICD-10-CM | POA: Diagnosis not present

## 2017-07-11 DIAGNOSIS — F0280 Dementia in other diseases classified elsewhere without behavioral disturbance: Secondary | ICD-10-CM | POA: Diagnosis not present

## 2017-07-15 DIAGNOSIS — Z9181 History of falling: Secondary | ICD-10-CM | POA: Diagnosis not present

## 2017-07-15 DIAGNOSIS — G20 Parkinson's disease: Secondary | ICD-10-CM | POA: Diagnosis not present

## 2017-07-15 DIAGNOSIS — F0280 Dementia in other diseases classified elsewhere without behavioral disturbance: Secondary | ICD-10-CM | POA: Diagnosis not present

## 2017-07-20 DIAGNOSIS — Z79899 Other long term (current) drug therapy: Secondary | ICD-10-CM | POA: Diagnosis not present

## 2017-07-20 DIAGNOSIS — E7800 Pure hypercholesterolemia, unspecified: Secondary | ICD-10-CM | POA: Diagnosis not present

## 2017-07-20 DIAGNOSIS — I10 Essential (primary) hypertension: Secondary | ICD-10-CM | POA: Diagnosis not present

## 2017-08-05 DIAGNOSIS — G20 Parkinson's disease: Secondary | ICD-10-CM | POA: Diagnosis not present

## 2017-08-05 DIAGNOSIS — G909 Disorder of the autonomic nervous system, unspecified: Secondary | ICD-10-CM | POA: Diagnosis not present

## 2017-08-05 DIAGNOSIS — F0280 Dementia in other diseases classified elsewhere without behavioral disturbance: Secondary | ICD-10-CM | POA: Diagnosis not present

## 2017-08-05 DIAGNOSIS — I10 Essential (primary) hypertension: Secondary | ICD-10-CM | POA: Diagnosis not present

## 2017-08-12 DIAGNOSIS — F444 Conversion disorder with motor symptom or deficit: Secondary | ICD-10-CM | POA: Diagnosis not present

## 2017-08-12 DIAGNOSIS — F0280 Dementia in other diseases classified elsewhere without behavioral disturbance: Secondary | ICD-10-CM | POA: Diagnosis not present

## 2017-08-12 DIAGNOSIS — G20 Parkinson's disease: Secondary | ICD-10-CM | POA: Diagnosis not present

## 2017-08-21 ENCOUNTER — Emergency Department (HOSPITAL_COMMUNITY)
Admission: EM | Admit: 2017-08-21 | Discharge: 2017-08-21 | Disposition: A | Discharge status: Home / Self Care | Payer: Medicare Other | Attending: Emergency Medicine | Admitting: Emergency Medicine

## 2017-08-21 DIAGNOSIS — E860 Dehydration: Secondary | ICD-10-CM | POA: Insufficient documentation

## 2017-08-21 DIAGNOSIS — F0280 Dementia in other diseases classified elsewhere without behavioral disturbance: Secondary | ICD-10-CM | POA: Diagnosis not present

## 2017-08-21 DIAGNOSIS — R9431 Abnormal electrocardiogram [ECG] [EKG]: Secondary | ICD-10-CM | POA: Diagnosis not present

## 2017-08-21 DIAGNOSIS — G20 Parkinson's disease: Secondary | ICD-10-CM | POA: Insufficient documentation

## 2017-08-21 DIAGNOSIS — R633 Feeding difficulties: Secondary | ICD-10-CM | POA: Diagnosis present

## 2017-08-21 DIAGNOSIS — Z87891 Personal history of nicotine dependence: Secondary | ICD-10-CM | POA: Diagnosis not present

## 2017-08-21 DIAGNOSIS — R402441 Other coma, without documented Glasgow coma scale score, or with partial score reported, in the field [EMT or ambulance]: Secondary | ICD-10-CM | POA: Diagnosis not present

## 2017-08-21 DIAGNOSIS — Z79899 Other long term (current) drug therapy: Secondary | ICD-10-CM | POA: Insufficient documentation

## 2017-08-21 LAB — BASIC METABOLIC PANEL
Anion gap: 7 (ref 5–15)
BUN: 34 mg/dL — AB (ref 6–20)
CHLORIDE: 104 mmol/L (ref 101–111)
CO2: 29 mmol/L (ref 22–32)
CREATININE: 1.32 mg/dL — AB (ref 0.61–1.24)
Calcium: 9.5 mg/dL (ref 8.9–10.3)
GFR calc non Af Amer: 53 mL/min — ABNORMAL LOW (ref >60)
Glucose, Bld: 99 mg/dL (ref 65–99)
POTASSIUM: 4.1 mmol/L (ref 3.5–5.1)
SODIUM: 140 mmol/L (ref 135–145)

## 2017-08-21 LAB — CBC WITH DIFFERENTIAL/PLATELET
Basophils Absolute: 0 10*3/uL (ref 0.0–0.1)
Basophils Relative: 0 %
Eosinophils Absolute: 0.1 10*3/uL (ref 0.0–0.7)
Eosinophils Relative: 2 %
HEMATOCRIT: 37.2 % — AB (ref 39.0–52.0)
HEMOGLOBIN: 11.8 g/dL — AB (ref 13.0–17.0)
LYMPHS ABS: 1.3 10*3/uL (ref 0.7–4.0)
LYMPHS PCT: 25 %
MCH: 25.3 pg — AB (ref 26.0–34.0)
MCHC: 31.7 g/dL (ref 30.0–36.0)
MCV: 79.7 fL (ref 78.0–100.0)
MONOS PCT: 8 %
Monocytes Absolute: 0.4 10*3/uL (ref 0.1–1.0)
NEUTROS PCT: 65 %
Neutro Abs: 3.3 10*3/uL (ref 1.7–7.7)
Platelets: 239 10*3/uL (ref 150–400)
RBC: 4.67 MIL/uL (ref 4.22–5.81)
RDW: 14.7 % (ref 11.5–15.5)
WBC: 5.1 10*3/uL (ref 4.0–10.5)

## 2017-08-21 LAB — CBG MONITORING, ED: Glucose-Capillary: 167 mg/dL — ABNORMAL HIGH (ref 65–99)

## 2017-08-21 MED ORDER — SODIUM CHLORIDE 0.9 % IV SOLN
INTRAVENOUS | Status: DC
Start: 2017-08-21 — End: 2017-08-21

## 2017-08-21 MED ORDER — SODIUM CHLORIDE 0.9 % IV BOLUS (SEPSIS)
500.0000 mL | Freq: Once | INTRAVENOUS | Status: AC
Start: 2017-08-21 — End: 2017-08-21
  Administered 2017-08-21: 500 mL via INTRAVENOUS

## 2017-08-21 NOTE — Discharge Instructions (Signed)
The testing today indicated that he is dehydrated.  Encouraged him to drink 3 or 4 glasses of water each day and try to eat 3 meals, to improve his nutritional status.  Follow-up with his primary care doctor as soon as possible to be seen for a checkup.

## 2017-08-21 NOTE — ED Provider Notes (Signed)
[facility]-EMERGENCY DEPT Provider Note   CSN: [id number] Arrival date & time: 08/21/17  1609     History   Chief Complaint No chief complaint on file.   HPI Shown Bradley Fleming is a 72 y.o. male.  Patient transferred by EMS, from home, at wife's request because he has not been eating much for a couple of days.  She is reportedly worried that he is "dehydrated."  Patient follows closely with his neurologist, last seen, 9 days ago for follow-up of Parkinson's disease.  Also saw his PCP, 2 weeks ago.  He is unable to give any history.  His wife is not with him when I saw the patient.  Level 5 caveat-dementia   HPI  Past Medical History:  Diagnosis Date  . Abnormality of gait 07/11/2015  . Camptocormia   . Focal dystonia   . Memory disorder 11/09/2015  . Parkinson disease [facility])     Patient Active Problem List   Diagnosis Date Noted  . Syncope 10/15/2016  . Dementia due to Parkinson's disease without behavioral disturbance (HCC)   . Encephalopathy acute 09/21/2016  . Memory disorder 11/09/2015  . Parkinson disease (HCC) 07/11/2015    Past Surgical History:  Procedure Laterality Date  . ANKLE SURGERY Right   . CATARACT EXTRACTION         Home Medications    Prior to Admission medications   Medication Sig Start Date End Date Taking? Authorizing Provider  carbidopa-levodopa (SINEMET CR) 50-200 MG tablet Take 1 tablet by mouth 3 (three) times daily. Patient taking differently: Take 1.5 tablets by mouth 3 (three) times daily.  03/15/16  Yes [name], MD  Cholecalciferol (VITAMIN D3) 1000 units CAPS Take 2,000 Units by mouth daily.    Yes [provider]  donepezil (ARICEPT) 10 MG tablet Take 10 mg by mouth at bedtime.   Yes [provider]  gabapentin (NEURONTIN) 300 MG capsule Take 1 capsule (300 mg total) by mouth 3 (three) times daily. 03/27/16  Yes [name], MD  GARLIC PO Take 1 tablet by mouth daily.   Yes  [provider]  memantine (NAMENDA) 10 MG tablet Take 10 mg by mouth 2 (two) times daily. 09/03/16 09/03/17 Yes [provider]  Multiple Vitamins-Minerals (MULTIVITAMIN ADULT PO) Take 1 tablet by mouth daily.   Yes [provider]  PARoxetine (PAXIL) 40 MG tablet Take 40 mg by mouth daily with breakfast.   Yes [provider]  selegiline (ELDEPRYL) 5 MG tablet Take 5 mg by mouth 2 (two) times daily. 07/20/16  Yes [provider]  ENSURE (ENSURE) Take 237 mLs by mouth 2 (two) times daily between meals.    [provider]    Family History Family History  Problem Relation Age of Onset  . Heart failure Mother   . Cirrhosis Father   . Alcohol abuse Father   . Tremor Sister   . COPD Brother   . Diabetes Brother   . Heart failure Brother     Social History Social History   Tobacco Use  . Smoking status: Former Games developer  . Smokeless tobacco: Never Used  Substance Use Topics  . Alcohol use: No    Alcohol/week: 0.0 oz    Comment: h/o social use  . Drug use: No    Comment: remote h/o use - maybe 10 years ago     Allergies   Banana   Review of Systems Review of Systems  Unable to perform  ROS: Dementia     Physical Exam Updated Vital Signs BP 135/82   Pulse 99   Temp 98.5 F (36.9 C)   Resp 20   SpO2 98%   Physical Exam  Constitutional: He appears well-developed. No distress.  Elderly, frail  HENT:  Head: Normocephalic and atraumatic.  Right Ear: External ear normal.  Left Ear: External ear normal.  Oral mucous membranes are somewhat dry.  Eyes: Conjunctivae and EOM are normal. Pupils are equal, round, and reactive to light.  Neck: Normal range of motion and phonation normal. Neck supple.  Cardiovascular: Normal rate, regular rhythm and normal heart sounds.  Pulmonary/Chest: Effort normal and breath sounds normal. He exhibits no bony tenderness.  Abdominal: Soft. There is no tenderness.  Musculoskeletal: Normal  range of motion.  Neurological: He is alert. No cranial nerve deficit or sensory deficit. He exhibits normal muscle tone. Coordination normal.  He is responsive, follows directions and answers questions.  Mild dysarthria is present.  Skin: Skin is warm, dry and intact.  Psychiatric: He has a normal mood and affect. His behavior is normal.  Nursing note and vitals reviewed.    ED Treatments / Results  Labs (all labs ordered are listed, but only abnormal results are displayed) Labs Reviewed  BASIC METABOLIC PANEL - Abnormal; Notable for the following components:      Result Value   BUN 34 (*)    Creatinine, Ser 1.32 (*)    GFR calc non Af Amer 53 (*)    All other components within normal limits  CBC WITH DIFFERENTIAL/PLATELET - Abnormal; Notable for the following components:   Hemoglobin 11.8 (*)    HCT 37.2 (*)    MCH 25.3 (*)    All other components within normal limits  CBG MONITORING, ED - Abnormal; Notable for the following components:   Glucose-Capillary 167 (*)    All other components within normal limits    EKG  EKG Interpretation  Date/Time:  Wednesday August 21 2017 16:21:08 EST Ventricular Rate:  98 PR Interval:    QRS Duration: 82 QT Interval:  340 QTC Calculation: 435 R Axis:   49 Text Interpretation:  Sinus rhythm Probable left atrial enlargement Left ventricular hypertrophy Borderline T abnormalities, inferior leads Since last tracing No significant change was found Confirmed by Mancel BaleWentz, Pedro Oldenburg [phone]([id number]) on 08/21/2017 4:56:13 PM       Radiology No results found.  Procedures Procedures (including critical care time)  Medications Ordered in ED Medications  sodium chloride 0.9 % bolus 500 mL (0 mLs Intravenous Stopped 08/21/17 1931)     Initial Impression / Assessment and Plan / ED Course  I have reviewed the triage vital signs and the nursing notes.  Pertinent labs & imaging results that were available during my care of the patient were reviewed by me  and considered in my medical decision making (see chart for details).      Patient Vitals for the past 24 hrs:  BP Temp Temp src Pulse Resp SpO2  08/21/17 1936 135/82 98.5 F (36.9 C) - 99 20 98 %  08/21/17 1629 132/81 98.4 F (36.9 C) Rectal 96 18 100 %  08/21/17 1615 - - - - - 97 %    At discharge- reevaluation with update and discussion. After initial assessment and treatment, an updated evaluation reveals he is comfortable and tolerating some oral nutrition.  Nursing contacted his wife and she came to get the patient. Mancel BaleElliott Olanna Percifield     Final Clinical Impressions(s) /  ED Diagnoses   Final diagnoses:  Dehydration    Mild dehydration with elevation of creatinine from baseline.  Patient improved after treatment here.  Doubt serious bacterial infection, metabolic instability or impending vascular collapse.  Nursing Notes Reviewed/ Care Coordinated Applicable Imaging Reviewed Interpretation of Laboratory Data incorporated into ED treatment  The patient appears reasonably screened and/or stabilized for discharge and I doubt any other medical condition or other [facility] requiring further screening, evaluation, or treatment in the ED at this time prior to discharge.  Plan: Home Medications-continue current medications; Home Treatments-push oral intake; return here if the recommended treatment, does not improve the symptoms; Recommended follow up-PCP, as needed   ED Discharge Orders    None       Mancel Bale, MD 08/21/17 2353

## 2017-08-21 NOTE — ED Notes (Signed)
Pt ate food and tolerating PO fluids well.

## 2017-08-21 NOTE — ED Triage Notes (Signed)
Per EMS, pt is coming from home after wife called and stated that pt "has not had anything to eat or drink since yesterday and I am concerned that he is dehydrated". Pt has a hx of Parkinson's. EMS reports that wife states pt is bed bound. Per EMS pt is currently AO to baseline per wife.

## 2017-08-21 NOTE — ED Notes (Signed)
Discharge instructions reviewed with pt and wife. RN assisted to dress pt and then RN and NT assisted pt in a WC out to the car and into car.

## 2017-08-21 NOTE — ED Notes (Signed)
Pt was trying to get out of bed.  Pt pulled himself up in the bed.

## 2017-08-21 NOTE — ED Notes (Signed)
Rn called pt's wife and she stated she is on her way to get him.

## 2017-08-28 DIAGNOSIS — Z79899 Other long term (current) drug therapy: Secondary | ICD-10-CM | POA: Diagnosis not present

## 2017-08-28 DIAGNOSIS — I10 Essential (primary) hypertension: Secondary | ICD-10-CM | POA: Diagnosis not present

## 2017-08-28 DIAGNOSIS — E7800 Pure hypercholesterolemia, unspecified: Secondary | ICD-10-CM | POA: Diagnosis not present

## 2017-08-28 DIAGNOSIS — Z09 Encounter for follow-up examination after completed treatment for conditions other than malignant neoplasm: Secondary | ICD-10-CM | POA: Diagnosis not present

## 2017-10-10 ENCOUNTER — Emergency Department (HOSPITAL_COMMUNITY)
Admission: EM | Admit: 2017-10-10 | Discharge: 2017-10-15 | Disposition: A | Discharge status: Home / Self Care | Payer: Medicare Other | Attending: Emergency Medicine | Admitting: Emergency Medicine

## 2017-10-10 ENCOUNTER — Encounter (HOSPITAL_COMMUNITY): Payer: Self-pay

## 2017-10-10 ENCOUNTER — Other Ambulatory Visit: Payer: Self-pay

## 2017-10-10 DIAGNOSIS — G20 Parkinson's disease: Secondary | ICD-10-CM | POA: Insufficient documentation

## 2017-10-10 DIAGNOSIS — Z79899 Other long term (current) drug therapy: Secondary | ICD-10-CM | POA: Insufficient documentation

## 2017-10-10 DIAGNOSIS — R4182 Altered mental status, unspecified: Secondary | ICD-10-CM

## 2017-10-10 DIAGNOSIS — Z87891 Personal history of nicotine dependence: Secondary | ICD-10-CM | POA: Insufficient documentation

## 2017-10-10 DIAGNOSIS — R030 Elevated blood-pressure reading, without diagnosis of hypertension: Secondary | ICD-10-CM | POA: Diagnosis not present

## 2017-10-10 DIAGNOSIS — R404 Transient alteration of awareness: Secondary | ICD-10-CM | POA: Diagnosis not present

## 2017-10-10 MED ORDER — MEMANTINE HCL 10 MG PO TABS
10.0000 mg | ORAL_TABLET | Freq: Two times a day (BID) | ORAL | Status: DC
  Administered 2017-10-11 – 2017-10-15 (×10): 10 mg via ORAL
  Filled 2017-10-10 (×12): qty 1

## 2017-10-10 MED ORDER — DONEPEZIL HCL 5 MG PO TABS
10.0000 mg | ORAL_TABLET | Freq: Every day | ORAL | Status: DC
  Administered 2017-10-11 – 2017-10-14 (×5): 10 mg via ORAL
  Filled 2017-10-10 (×5): qty 2

## 2017-10-10 MED ORDER — PAROXETINE HCL 20 MG PO TABS
40.0000 mg | ORAL_TABLET | Freq: Every day | ORAL | Status: DC
  Administered 2017-10-11 – 2017-10-15 (×5): 40 mg via ORAL
  Filled 2017-10-10 (×5): qty 2

## 2017-10-10 MED ORDER — ADULT MULTIVITAMIN W/MINERALS CH
1.0000 | ORAL_TABLET | Freq: Every day | ORAL | Status: DC
  Administered 2017-10-11 – 2017-10-15 (×5): 1 via ORAL
  Filled 2017-10-10 (×6): qty 1

## 2017-10-10 MED ORDER — CARBIDOPA-LEVODOPA ER 50-200 MG PO TBCR
1.5000 | EXTENDED_RELEASE_TABLET | Freq: Three times a day (TID) | ORAL | Status: DC
  Administered 2017-10-11: 1.5 via ORAL
  Filled 2017-10-10 (×3): qty 1.5

## 2017-10-10 MED ORDER — GABAPENTIN 300 MG PO CAPS
300.0000 mg | ORAL_CAPSULE | Freq: Three times a day (TID) | ORAL | Status: DC
  Administered 2017-10-11 – 2017-10-15 (×14): 300 mg via ORAL
  Filled 2017-10-10 (×14): qty 1

## 2017-10-10 MED ORDER — ENSURE ENLIVE PO LIQD
237.0000 mL | Freq: Two times a day (BID) | ORAL | Status: DC
  Administered 2017-10-11 – 2017-10-15 (×7): 237 mL via ORAL
  Filled 2017-10-10 (×11): qty 237

## 2017-10-10 MED ORDER — SELEGILINE HCL 5 MG PO TABS
5.0000 mg | ORAL_TABLET | Freq: Two times a day (BID) | ORAL | Status: DC
  Administered 2017-10-11 – 2017-10-15 (×10): 5 mg via ORAL
  Filled 2017-10-10 (×11): qty 1

## 2017-10-10 MED ORDER — VITAMIN D 1000 UNITS PO TABS
2000.0000 [IU] | ORAL_TABLET | Freq: Every day | ORAL | Status: DC
  Administered 2017-10-11 – 2017-10-15 (×5): 2000 [IU] via ORAL
  Filled 2017-10-10 (×5): qty 2
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
## 2017-10-10 NOTE — Discharge Instructions (Addendum)
Evaluation today does not show any acute psychiatric disorders that would require hospitalization.  Make sure you are eating and drinking normally, and follow-up with your primary care doctor as soon as possible for further evaluation and treatment.
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
## 2017-10-10 NOTE — ED Triage Notes (Signed)
EMS reports from home AMS/Behavioral, caregiver found Pt sitting upright eyes closed but refused to communicate, EMS reports responds to painful stimuli, pupils reactive, muscle tone intact, EMS dropped hand to face and PT responds with movement of hand away from face, winced with NPA and IV placement. Pt family states Pt responds this way previously when Pt wants to be uncooperative.  Nurse note, Pt communicated with me on pain assessment and vital signs including opening mouth and lifting tounge, States he did not have any drugs or alcohol prior to arrival today  BP 148/86 HR 87 Resp 14 Sp02 98 RA CBG 99  20ga RAC NPA right nare

## 2017-10-11 DIAGNOSIS — R4182 Altered mental status, unspecified: Secondary | ICD-10-CM | POA: Diagnosis not present

## 2017-10-11 MED ORDER — CARBIDOPA-LEVODOPA ER 25-100 MG PO TBCR
1.5000 | EXTENDED_RELEASE_TABLET | Freq: Three times a day (TID) | ORAL | Status: DC
  Administered 2017-10-11 – 2017-10-15 (×12): 1.5 via ORAL
  Filled 2017-10-11 (×14): qty 1.5
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
## 2017-10-11 NOTE — ED Notes (Signed)
Patient resting with eyes closed. Equal chest rise and fall noted. 

## 2017-10-11 NOTE — Progress Notes (Addendum)
CSW received handoffand called [facility] [address] and spoke to Millers FallsJudy in admissions. Darel HongJudy confirmed pt's wife had been to [facility] and that [facility] bed [facility], but that an RN cannot arrive to the ED until Monday to assess the pt.    CSW explained to Darel HongJudy that it is of utmost importance that pt be assessed before Monday and Darel HongJudy wrote down this writer's number (1st and 2nd shift cell phone for [facility] CSW) and that if there is a nurse available to assess the pt this weekend then the [facility] ED CSW on-duty will be notified.  Per 1st shift ED CSW pt's wife is still refusing to see the pt and take the pt back home.    Per Per 1st shift ED CSW pt's wife is willing to private pay.  CSW will update the CSW Asst Director.  5:02 PM CSW called to confirmed with Darel HongJudy on [phone], that pt's wife had discussed "private pay" with staff at [facility] and Darel HongJudy states she is unaware what was discussed with that admission person but feels sure money was discussed.  If Darel HongJudy becomes aware Darel HongJudy will update the CSW at this writer's work cell number. Darel HongJudy asked for updates as well at ph: [phone].  CSW will continue to follow for D/C needs.  Dorothe PeaJonathan F. Lafern Brinkley, LCSW, LCAS, CSI Clinical Social Worker Ph: [phone]
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
## 2017-10-11 NOTE — ED Notes (Signed)
Patient given meal tray.

## 2017-10-11 NOTE — ED Notes (Signed)
Po fluids and sand witch given

## 2017-10-11 NOTE — ED Notes (Signed)
Pt transferred to hospital bed
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
## 2017-10-12 DIAGNOSIS — R4182 Altered mental status, unspecified: Secondary | ICD-10-CM | POA: Diagnosis not present

## 2017-10-12 NOTE — ED Notes (Signed)
Pt did not eat any food. Pt states "Im not hungry" Pt offered fluids. Pt drinking with no issues.
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
## 2017-10-13 DIAGNOSIS — R4182 Altered mental status, unspecified: Secondary | ICD-10-CM | POA: Diagnosis not present

## 2017-10-13 NOTE — ED Notes (Signed)
Sitter attempting to get pt to drink ensure, or sip it over the next hour.

## 2017-10-14 DIAGNOSIS — R4182 Altered mental status, unspecified: Secondary | ICD-10-CM | POA: Diagnosis not present

## 2017-10-14 NOTE — Progress Notes (Addendum)
Patient ID: Ferd GlassingGary David Derasmo, male   DOB: 07/14/1946, 72 y.o.   MRN: [id number]  Pt was seen and chart reviewed by treatment team.  Pt denies suicidal/homicidal ideation, denies auditory/visual hallucinations and does not appear to be responding to internal stimuli. Pt was sitting in the hospital bed with his eyes closed and was calm and cooperative. Pt is clear for discharge form the psychiatry service.   Laveda AbbeLaurie Britton Parks, NP-C 10-14-2017       1734  Patient's chart reviewed and case discussed with the physician extender and developed treatment plan. Reviewed the information documented and agree with the treatment plan.  Juanetta BeetsJacqueline Mikinzie Maciejewski, DO 10/14/17 5:59 PM

## 2017-10-14 NOTE — Progress Notes (Addendum)
2:52pm- CSW spoke with RN Toni Amendourtney and was informed that no one has been by to see pt as of this time. CSW has left VM for wife updating her of this as requested by her.   9:41am- CSW spoke with pt's wife and informed her that if pt is not appropriate for [facility] then pt would have to be picked up and transported back home this evening as pt can not stay in the ED any longer. Ms. Logan Boresvans expressed that she is meeting with her lawyer here shortly. CSW verbalized understanding of this but reiterated that if Artist PaisShawna sees that pt is not a good fit for ALF then pt would still need to be picked up CSW sought further information from wife on their plans to pay for ALF is approved by [place] wife expressed that they plan to pay out of pocket as they do not qualify for Medicaid.   CSW received call from [place] [facility] and was informed that she is planning to come and assess pt today for thero facility. Artist PaisShawna reports that she spoke with pt's wife and was informed of some behaviors that were alarming to her for the facility and would like to speak with pt at bedside. Shawna expressed that pt's wife will be going down to the court to try and get guardianship of pt as well. CSW to continue to follow for discharge needs.   Claude MangesKierra S. Avry Roedl, MSW, LCSW-A Emergency Department Clinical Social Worker [phone]

## 2017-10-14 NOTE — ED Notes (Signed)
Artist PaisShawna, RN from [facility] came to assess pt and stated he was too drowsy to assess. She stated she would return in the AM for reassessment regarding placement. She can be contacted at [phone].

## 2017-10-14 NOTE — Progress Notes (Addendum)
2nd shift [facility] ED CSW staffed pt case with the CSW Director who stated if pt was psychiatrically and medically cleared and pt was pot's own legal guardian and desires to return home, then the pt had a right to return home.  CSW Director stated pt could ask for a ride from family but if family was unwilling to transport the pt then transportation could be provided, if pt desired to return home.  TTS met with pt and pt was psychiatrically cleared.  CSW reviewed 1st shift CSW's notes stating, "CSW received call from [place] with [facility] and was informed that she is planning to come and assess pt today for thero facility. Santa Genera reports that she spoke with pt's wife and was informed of some behaviors that were alarming to her for the facility and would like to speak with pt at bedside. Shawna expressed that pt's wife will be going down to the court to try and get guardianship of pt as well. CSW to continue to follow for discharge needs".  2nd shift [facility] ED CSW spoke to pt's [facility] ED RN who stated that [facility] RN at [facility] was at the [facility] ED on the afternoon of 3/18 (today) and met with the pt, but [facility] RN of [facility] stated pt was "too sleepy" to assess the pt and would return on 3/18.  [facility] ED RN expressed surprise at this to the CSW because pt presented as A&O to the [facility] ED RN at this time.  CSW met with pt who was asleep when CSW entered the room but when his name was spoken the pt immediately awoke and asked "What can I do for you?"  CSW asked the pt  he would like to go home and pt stated yes, when can I go?"  CSW stated pt is cleared for D/C and would pt like to call his wife and ask for transport?  Pt stated yes.  CSW provided pt with a phone and pt called his wife.  Pt after some conversation with his wife requested the CSW come into his room and stated, "My wife can't pick me up".  CSW stated he understood and told pt he could continue his conversation with his wife".  CSW met with pt and  asked pt what city he lived in and pt at first asked is it [place], [place] (Pt lives in [place] to the CSW's knowledge)?  CSW asked pt what city he lived in now and after some thought pt stated, "I don't know".  CSW asked pt if he knew what hospital the pt is in the pt after some thought stated, "I don't know" while presenting as concerned with his inability to remember A[facility] a pained expression on his face".  Pt also was not able to tell the CSW what year it was, when asked by the CSW.  CSW staffed pt with CSW Director again and CSW Director directed that EPD on the night of 3/18 was to be consulted and if pt is seen to have capacity by the EPD then pt may be D/C'd if EPD feels this is appropriate.  CSW Director stated if pt is not seen as having sufficient capacity by the EPD and CSW, to be sent home via taxi, then pt was to be seen on the morning of 3/19 by psychiatry and pt is to be seen by psychiatry to determine capacity in order to be able to properly inform APS of pt's capacity in order to seek assistance from  APS for the pt's welfare.  CSW Director states if psychiatry is unable to seek to assess the pt's capacity then the CSW Director is to be informed so the CSW Director can seek assistance from psychiatry, in seeking to assess the pt's capacity in order to facilitate a timely D/C, disposition or timely treatment for the pt, which hinges on the pt's "capacity".  2nd shift ED CSW will leave handoff for 1st shift ED CSW covering from [facility] at [phone].  [facility]'s Santa Genera is currently planning on assessing the pt on possible entry to [facility] [address] on the morning of 3/19.  CSW met with EDP on 3/18 who stated based on notes and collateral information EPD is not able to deem pt as having sufficient capacity until pt is seen by psychiatry in the morning.  CSW will continue to follow for D/C needs.  Alphonse Guild. Kinneth Fujiwara, LCSW, LCAS, CSI Clinical Social Worker Ph:  [phone]

## 2017-10-14 NOTE — ED Notes (Signed)
Pt with elevated bp of 193/108 and hr of 107. Pt asymptomatic at this time; no noted distress. Respirations regular and unlabored; skin warm and dry. Dr. Bebe ShaggyWickline notified; no new orders at this time.

## 2017-10-15 DIAGNOSIS — G20 Parkinson's disease: Secondary | ICD-10-CM | POA: Diagnosis not present

## 2017-10-15 DIAGNOSIS — R4182 Altered mental status, unspecified: Secondary | ICD-10-CM | POA: Diagnosis not present

## 2017-10-15 NOTE — Progress Notes (Addendum)
1:42pm-CSW received another call from pt's wife informing CSW that wife still has not heard from [facility] and that she would like for CSW to send pt home and that she will have the fire department help her with pt. CSW sought further infoamtion on the [facility] that wife expressed to CSW earlier today. She expressed that they informed her that they do not have anyone that can come out. Pt's wife was very tearful and offered other resources to help wife in getting pt the care that pt is needing. Wife was very upset informing CSW that "no one cares, and you all only want to get him out". CSW explained agian that pt has been here since last Thursday and from reports given pt is cleared both by psych and medically which means that hospital can not continue to hold pt for placement at this time. CSW is honoring pt's wife requests to send pt home at this time .CSW has spoken with MD and RNCM and both are agreeable [facility] and pt so that wife will have some assistance with placing pt into ALF of choice from home. CSW asked wife for address and wife. Wife provided CSW with address but wouldn't give CSW the area code for address. Wife expressed "you all are smart and can figure that out, BYE".   CSW spoke with Bradley Fleming earlier this morning about pt and she is still aware of this. Bradley Fleming expressed that she plans to follow up with family at home regarding further needs for placement.   1:32pm- CSW recieve call back from pt's wife expressing to CSW that she is tired of being harassed by staff. CSW apologized to pt's wife for these feelings and informed her that CSW and other staff are just trying to establish a plan for pt. Pt's wife expressed that she is getting a Clinical research associatelawyer as she feels she has been harassed. CSW expressed verbal understanding of this.  12:55pm- CSW received call back from both Bradley Fleming and Bradley Fleming and was informed that [facility] there is no indication for pt needing to  have a capacity exam done. Bradley Fleming expressed that she saw pt as of yesterday and pt was cleared. Bradley Fleming suggested that I pt is ready to d/c then pt is able to do so. CSW called pt's wife back as informed. CSW left VM for her to call CSW. Back CSW will discuss with wife picking pt up or us transporting pt back home. CSW still following for dsicharghe needs.   12:46pm- CSW spoke with Bradley Fleming and was informed that she received contact information for Bradley Fleming (AD) to speak with him concerning the questions that they want answered regarding pt having capacity. CSW asked that Bradley Fleming call CSW back to inform CSW of capacity level. CSW received another call from pt's wife asking that CSW send pt's information to other facilities. CSW expressed that CSW would reach out to AD to see about this.   12:37pm- CSW received call from pt's wife informing CSW that she wanting to set up service through [facility] for private Aides to care for pt at home. CSW explained that CSW would follow up with RNCM to see if she could set that service up for pt and family. Wife expressed that she can set services up herself but doesn't want to take pt until everything is in place with these services. CSW began to explain that CSW and other staff can  not continue to wait and that if she is wanting pt back home then CSW would follow up with physiatrist and about capacity and if cleared then CSW could arrange transport home for pt. Wife expressed that someone else was calling on the phone and clicked over before CSW could finsih explaining as well as offer other resources for care to wife.   CSW to follow up with Dr Annice Pih (Pshciytrist) to see about capacity exam for pt.   10:52am- CSW reached back ou tot pt's wife to inform her that per Chi[facility], pt has been in the ED since last Thursday and is needing to be placed or returned back home to her today. CSW was informed by wife that she has another place  to place pt in mind, however would not tell CSW when asked so that CSW could follow up with facility about placement. Wife reported that she wanted to give Bradley Fleming from [facility] more time to see and assess pt as she states "morning isnt over with yet". CSW and RNCM informed her that [facility] was suppose to see pt as of last week so we have given her time. CSW began to explained the next steps (calling APS if pt is not discharged as well as [facility] pursing legal guardianship if placement is not found or if pt is not picked up). When wife hung up on CSW once more. Wife expressed"I gotta use the bathroom so ill call you back".   CSW received call from Medical Director and Chiropodist asking for an update regarding pt's care. CSW informed both gentlemen that pt was suppose to be seen by [place] from [place] as of 10/14/17. Per notes left in chart, [facility] came to assess pt however pt was drowsy therefore Bradley Fleming expressed that she would return this morning to assess again. CSW was informed by Barista that CSW needs to reach out to other ALF's for placing pt. CSW spoke with wife Bradley Fleming to inform her that CSW has reached out to to other ALF's and that they may be reaching out to her today for further information. Wife would not allow CSW to finish speaking and hung up on CSW saying "I dont see what the problem is, he's [facility] so what is the issue?" CSW was not able to explain any further details to wife or inform wife of next steps in plan of care if pt doesn't d/c today or soon. CSW called Bradley Fleming from [facility] to follow up on status of admission for pt to the facility. CSW left VM asking that [facility] call CSW back. CSW spoke with Eugenio Hoes Clinical Liaison for ALF's and Memory Care here in [place] to see about placement option for pt and she expressed that she would reach out to pt and family for further needs. Bradley Fleming will follow up with family  today after lunch. CSW still following for discharge needs.     Bradley Fleming, MSW, LCSW-A Emergency Department Clinical Social Worker [phone]

## 2017-10-15 NOTE — Care Management Note (Signed)
Case Management Note  CM consulted by CS[facility] since pt is being D/C home.  Spoke with Mrs. Huyser via phone number in chart who accepted and then refused HH several times before finally refusing during discussion of what HH does and how much it costs.  She remained upset about the situation and stated she plans to call Medicare and complain.  She reports that the hospital has done nothing but try to "get him out" since last week and has never updated her about anything.  CM advised her that she has been given multiple options since last week and that the pt was actually D/C last week.  Pt's wife went on to say this CM inferred her stupidity but that she was college educated, worked for [employer], worked with congress, and worked in [employer].  She advised she doesn't like living in [place] because we "don't treat black people right."  CM attempted calm refocusing conversation with little success.  Pt's wife did thank CM at end of call.  No further CM needs noted at this time.

## 2017-10-15 NOTE — ED Notes (Signed)
Social worker notified me to [place] [address]

## 2017-10-16 DIAGNOSIS — E7800 Pure hypercholesterolemia, unspecified: Secondary | ICD-10-CM | POA: Diagnosis not present

## 2017-10-16 DIAGNOSIS — F0280 Dementia in other diseases classified elsewhere without behavioral disturbance: Secondary | ICD-10-CM | POA: Diagnosis not present

## 2017-10-16 DIAGNOSIS — F0390 Unspecified dementia without behavioral disturbance: Secondary | ICD-10-CM | POA: Diagnosis not present

## 2017-10-16 DIAGNOSIS — I10 Essential (primary) hypertension: Secondary | ICD-10-CM | POA: Diagnosis not present

## 2017-10-16 DIAGNOSIS — G3183 Dementia with Lewy bodies: Secondary | ICD-10-CM | POA: Diagnosis not present

## 2017-10-18 DIAGNOSIS — F0390 Unspecified dementia without behavioral disturbance: Secondary | ICD-10-CM | POA: Diagnosis not present

## 2017-10-24 DIAGNOSIS — F0281 Dementia in other diseases classified elsewhere with behavioral disturbance: Secondary | ICD-10-CM | POA: Diagnosis not present

## 2017-10-24 DIAGNOSIS — F444 Conversion disorder with motor symptom or deficit: Secondary | ICD-10-CM | POA: Diagnosis not present

## 2017-10-24 DIAGNOSIS — G20 Parkinson's disease: Secondary | ICD-10-CM | POA: Diagnosis not present

## 2017-11-04 DIAGNOSIS — F0281 Dementia in other diseases classified elsewhere with behavioral disturbance: Secondary | ICD-10-CM | POA: Diagnosis not present

## 2017-11-04 DIAGNOSIS — F444 Conversion disorder with motor symptom or deficit: Secondary | ICD-10-CM | POA: Diagnosis not present

## 2017-11-04 DIAGNOSIS — G894 Chronic pain syndrome: Secondary | ICD-10-CM | POA: Diagnosis not present

## 2017-11-04 DIAGNOSIS — I10 Essential (primary) hypertension: Secondary | ICD-10-CM | POA: Diagnosis not present

## 2017-11-04 DIAGNOSIS — R471 Dysarthria and anarthria: Secondary | ICD-10-CM | POA: Diagnosis not present

## 2017-11-04 DIAGNOSIS — G20 Parkinson's disease: Secondary | ICD-10-CM | POA: Diagnosis not present

## 2017-11-04 DIAGNOSIS — F17211 Nicotine dependence, cigarettes, in remission: Secondary | ICD-10-CM | POA: Diagnosis not present

## 2017-11-04 DIAGNOSIS — Z9181 History of falling: Secondary | ICD-10-CM | POA: Diagnosis not present

## 2017-11-04 DIAGNOSIS — M48061 Spinal stenosis, lumbar region without neurogenic claudication: Secondary | ICD-10-CM | POA: Diagnosis not present

## 2017-11-06 DIAGNOSIS — G894 Chronic pain syndrome: Secondary | ICD-10-CM | POA: Diagnosis not present

## 2017-11-06 DIAGNOSIS — G20 Parkinson's disease: Secondary | ICD-10-CM | POA: Diagnosis not present

## 2017-11-06 DIAGNOSIS — R471 Dysarthria and anarthria: Secondary | ICD-10-CM | POA: Diagnosis not present

## 2017-11-06 DIAGNOSIS — F0281 Dementia in other diseases classified elsewhere with behavioral disturbance: Secondary | ICD-10-CM | POA: Diagnosis not present

## 2017-11-06 DIAGNOSIS — M48061 Spinal stenosis, lumbar region without neurogenic claudication: Secondary | ICD-10-CM | POA: Diagnosis not present

## 2017-11-06 DIAGNOSIS — F444 Conversion disorder with motor symptom or deficit: Secondary | ICD-10-CM | POA: Diagnosis not present

## 2017-11-08 DIAGNOSIS — R471 Dysarthria and anarthria: Secondary | ICD-10-CM | POA: Diagnosis not present

## 2017-11-08 DIAGNOSIS — F444 Conversion disorder with motor symptom or deficit: Secondary | ICD-10-CM | POA: Diagnosis not present

## 2017-11-08 DIAGNOSIS — G20 Parkinson's disease: Secondary | ICD-10-CM | POA: Diagnosis not present

## 2017-11-08 DIAGNOSIS — M48061 Spinal stenosis, lumbar region without neurogenic claudication: Secondary | ICD-10-CM | POA: Diagnosis not present

## 2017-11-08 DIAGNOSIS — G894 Chronic pain syndrome: Secondary | ICD-10-CM | POA: Diagnosis not present

## 2017-11-08 DIAGNOSIS — F0281 Dementia in other diseases classified elsewhere with behavioral disturbance: Secondary | ICD-10-CM | POA: Diagnosis not present

## 2017-11-11 DIAGNOSIS — G20 Parkinson's disease: Secondary | ICD-10-CM | POA: Diagnosis not present

## 2017-11-11 DIAGNOSIS — R471 Dysarthria and anarthria: Secondary | ICD-10-CM | POA: Diagnosis not present

## 2017-11-11 DIAGNOSIS — G894 Chronic pain syndrome: Secondary | ICD-10-CM | POA: Diagnosis not present

## 2017-11-11 DIAGNOSIS — F444 Conversion disorder with motor symptom or deficit: Secondary | ICD-10-CM | POA: Diagnosis not present

## 2017-11-11 DIAGNOSIS — F0281 Dementia in other diseases classified elsewhere with behavioral disturbance: Secondary | ICD-10-CM | POA: Diagnosis not present

## 2017-11-11 DIAGNOSIS — M48061 Spinal stenosis, lumbar region without neurogenic claudication: Secondary | ICD-10-CM | POA: Diagnosis not present

## 2017-11-12 DIAGNOSIS — F0281 Dementia in other diseases classified elsewhere with behavioral disturbance: Secondary | ICD-10-CM | POA: Diagnosis not present

## 2017-11-12 DIAGNOSIS — M48061 Spinal stenosis, lumbar region without neurogenic claudication: Secondary | ICD-10-CM | POA: Diagnosis not present

## 2017-11-12 DIAGNOSIS — F444 Conversion disorder with motor symptom or deficit: Secondary | ICD-10-CM | POA: Diagnosis not present

## 2017-11-12 DIAGNOSIS — G894 Chronic pain syndrome: Secondary | ICD-10-CM | POA: Diagnosis not present

## 2017-11-12 DIAGNOSIS — G20 Parkinson's disease: Secondary | ICD-10-CM | POA: Diagnosis not present

## 2017-11-12 DIAGNOSIS — R471 Dysarthria and anarthria: Secondary | ICD-10-CM | POA: Diagnosis not present

## 2017-11-13 DIAGNOSIS — R471 Dysarthria and anarthria: Secondary | ICD-10-CM | POA: Diagnosis not present

## 2017-11-13 DIAGNOSIS — M48061 Spinal stenosis, lumbar region without neurogenic claudication: Secondary | ICD-10-CM | POA: Diagnosis not present

## 2017-11-13 DIAGNOSIS — G894 Chronic pain syndrome: Secondary | ICD-10-CM | POA: Diagnosis not present

## 2017-11-13 DIAGNOSIS — F444 Conversion disorder with motor symptom or deficit: Secondary | ICD-10-CM | POA: Diagnosis not present

## 2017-11-13 DIAGNOSIS — F0281 Dementia in other diseases classified elsewhere with behavioral disturbance: Secondary | ICD-10-CM | POA: Diagnosis not present

## 2017-11-13 DIAGNOSIS — G20 Parkinson's disease: Secondary | ICD-10-CM | POA: Diagnosis not present

## 2017-11-14 DIAGNOSIS — R471 Dysarthria and anarthria: Secondary | ICD-10-CM | POA: Diagnosis not present

## 2017-11-14 DIAGNOSIS — F0281 Dementia in other diseases classified elsewhere with behavioral disturbance: Secondary | ICD-10-CM | POA: Diagnosis not present

## 2017-11-14 DIAGNOSIS — M48061 Spinal stenosis, lumbar region without neurogenic claudication: Secondary | ICD-10-CM | POA: Diagnosis not present

## 2017-11-14 DIAGNOSIS — G20 Parkinson's disease: Secondary | ICD-10-CM | POA: Diagnosis not present

## 2017-11-14 DIAGNOSIS — F444 Conversion disorder with motor symptom or deficit: Secondary | ICD-10-CM | POA: Diagnosis not present

## 2017-11-14 DIAGNOSIS — G894 Chronic pain syndrome: Secondary | ICD-10-CM | POA: Diagnosis not present

## 2017-11-15 DIAGNOSIS — G20 Parkinson's disease: Secondary | ICD-10-CM | POA: Diagnosis not present

## 2017-11-15 DIAGNOSIS — F0281 Dementia in other diseases classified elsewhere with behavioral disturbance: Secondary | ICD-10-CM | POA: Diagnosis not present

## 2017-11-15 DIAGNOSIS — G894 Chronic pain syndrome: Secondary | ICD-10-CM | POA: Diagnosis not present

## 2017-11-15 DIAGNOSIS — F444 Conversion disorder with motor symptom or deficit: Secondary | ICD-10-CM | POA: Diagnosis not present

## 2017-11-15 DIAGNOSIS — R471 Dysarthria and anarthria: Secondary | ICD-10-CM | POA: Diagnosis not present

## 2017-11-15 DIAGNOSIS — M48061 Spinal stenosis, lumbar region without neurogenic claudication: Secondary | ICD-10-CM | POA: Diagnosis not present

## 2017-11-18 DIAGNOSIS — F0281 Dementia in other diseases classified elsewhere with behavioral disturbance: Secondary | ICD-10-CM | POA: Diagnosis not present

## 2017-11-18 DIAGNOSIS — G894 Chronic pain syndrome: Secondary | ICD-10-CM | POA: Diagnosis not present

## 2017-11-18 DIAGNOSIS — F444 Conversion disorder with motor symptom or deficit: Secondary | ICD-10-CM | POA: Diagnosis not present

## 2017-11-18 DIAGNOSIS — R471 Dysarthria and anarthria: Secondary | ICD-10-CM | POA: Diagnosis not present

## 2017-11-18 DIAGNOSIS — G20 Parkinson's disease: Secondary | ICD-10-CM | POA: Diagnosis not present

## 2017-11-18 DIAGNOSIS — M48061 Spinal stenosis, lumbar region without neurogenic claudication: Secondary | ICD-10-CM | POA: Diagnosis not present

## 2017-11-19 DIAGNOSIS — G894 Chronic pain syndrome: Secondary | ICD-10-CM | POA: Diagnosis not present

## 2017-11-19 DIAGNOSIS — G20 Parkinson's disease: Secondary | ICD-10-CM | POA: Diagnosis not present

## 2017-11-19 DIAGNOSIS — F0281 Dementia in other diseases classified elsewhere with behavioral disturbance: Secondary | ICD-10-CM | POA: Diagnosis not present

## 2017-11-19 DIAGNOSIS — M48061 Spinal stenosis, lumbar region without neurogenic claudication: Secondary | ICD-10-CM | POA: Diagnosis not present

## 2017-11-19 DIAGNOSIS — R471 Dysarthria and anarthria: Secondary | ICD-10-CM | POA: Diagnosis not present

## 2017-11-19 DIAGNOSIS — F444 Conversion disorder with motor symptom or deficit: Secondary | ICD-10-CM | POA: Diagnosis not present

## 2017-11-21 DIAGNOSIS — G894 Chronic pain syndrome: Secondary | ICD-10-CM | POA: Diagnosis not present

## 2017-11-21 DIAGNOSIS — M48061 Spinal stenosis, lumbar region without neurogenic claudication: Secondary | ICD-10-CM | POA: Diagnosis not present

## 2017-11-21 DIAGNOSIS — F0281 Dementia in other diseases classified elsewhere with behavioral disturbance: Secondary | ICD-10-CM | POA: Diagnosis not present

## 2017-11-21 DIAGNOSIS — R471 Dysarthria and anarthria: Secondary | ICD-10-CM | POA: Diagnosis not present

## 2017-11-21 DIAGNOSIS — F444 Conversion disorder with motor symptom or deficit: Secondary | ICD-10-CM | POA: Diagnosis not present

## 2017-11-21 DIAGNOSIS — G20 Parkinson's disease: Secondary | ICD-10-CM | POA: Diagnosis not present

## 2017-11-22 DIAGNOSIS — I10 Essential (primary) hypertension: Secondary | ICD-10-CM | POA: Diagnosis not present

## 2017-11-22 DIAGNOSIS — R471 Dysarthria and anarthria: Secondary | ICD-10-CM | POA: Diagnosis not present

## 2017-11-22 DIAGNOSIS — F444 Conversion disorder with motor symptom or deficit: Secondary | ICD-10-CM | POA: Diagnosis not present

## 2017-11-22 DIAGNOSIS — G894 Chronic pain syndrome: Secondary | ICD-10-CM | POA: Diagnosis not present

## 2017-11-22 DIAGNOSIS — G20 Parkinson's disease: Secondary | ICD-10-CM | POA: Diagnosis not present

## 2017-11-22 DIAGNOSIS — E7800 Pure hypercholesterolemia, unspecified: Secondary | ICD-10-CM | POA: Diagnosis not present

## 2017-11-22 DIAGNOSIS — F0280 Dementia in other diseases classified elsewhere without behavioral disturbance: Secondary | ICD-10-CM | POA: Diagnosis not present

## 2017-11-22 DIAGNOSIS — M48061 Spinal stenosis, lumbar region without neurogenic claudication: Secondary | ICD-10-CM | POA: Diagnosis not present

## 2017-11-22 DIAGNOSIS — G3183 Dementia with Lewy bodies: Secondary | ICD-10-CM | POA: Diagnosis not present

## 2017-11-22 DIAGNOSIS — F0281 Dementia in other diseases classified elsewhere with behavioral disturbance: Secondary | ICD-10-CM | POA: Diagnosis not present

## 2017-11-25 DIAGNOSIS — F444 Conversion disorder with motor symptom or deficit: Secondary | ICD-10-CM | POA: Diagnosis not present

## 2017-11-25 DIAGNOSIS — F0281 Dementia in other diseases classified elsewhere with behavioral disturbance: Secondary | ICD-10-CM | POA: Diagnosis not present

## 2017-11-25 DIAGNOSIS — M48061 Spinal stenosis, lumbar region without neurogenic claudication: Secondary | ICD-10-CM | POA: Diagnosis not present

## 2017-11-25 DIAGNOSIS — R471 Dysarthria and anarthria: Secondary | ICD-10-CM | POA: Diagnosis not present

## 2017-11-25 DIAGNOSIS — G20 Parkinson's disease: Secondary | ICD-10-CM | POA: Diagnosis not present

## 2017-11-25 DIAGNOSIS — G894 Chronic pain syndrome: Secondary | ICD-10-CM | POA: Diagnosis not present

## 2017-11-26 DIAGNOSIS — G894 Chronic pain syndrome: Secondary | ICD-10-CM | POA: Diagnosis not present

## 2017-11-26 DIAGNOSIS — R471 Dysarthria and anarthria: Secondary | ICD-10-CM | POA: Diagnosis not present

## 2017-11-26 DIAGNOSIS — G20 Parkinson's disease: Secondary | ICD-10-CM | POA: Diagnosis not present

## 2017-11-26 DIAGNOSIS — M48061 Spinal stenosis, lumbar region without neurogenic claudication: Secondary | ICD-10-CM | POA: Diagnosis not present

## 2017-11-26 DIAGNOSIS — F444 Conversion disorder with motor symptom or deficit: Secondary | ICD-10-CM | POA: Diagnosis not present

## 2017-11-26 DIAGNOSIS — F0281 Dementia in other diseases classified elsewhere with behavioral disturbance: Secondary | ICD-10-CM | POA: Diagnosis not present

## 2017-11-28 DIAGNOSIS — F0281 Dementia in other diseases classified elsewhere with behavioral disturbance: Secondary | ICD-10-CM | POA: Diagnosis not present

## 2017-11-28 DIAGNOSIS — M48061 Spinal stenosis, lumbar region without neurogenic claudication: Secondary | ICD-10-CM | POA: Diagnosis not present

## 2017-11-28 DIAGNOSIS — G20 Parkinson's disease: Secondary | ICD-10-CM | POA: Diagnosis not present

## 2017-11-28 DIAGNOSIS — F444 Conversion disorder with motor symptom or deficit: Secondary | ICD-10-CM | POA: Diagnosis not present

## 2017-11-28 DIAGNOSIS — R471 Dysarthria and anarthria: Secondary | ICD-10-CM | POA: Diagnosis not present

## 2017-11-28 DIAGNOSIS — G894 Chronic pain syndrome: Secondary | ICD-10-CM | POA: Diagnosis not present

## 2017-12-03 DIAGNOSIS — F444 Conversion disorder with motor symptom or deficit: Secondary | ICD-10-CM | POA: Diagnosis not present

## 2017-12-03 DIAGNOSIS — R471 Dysarthria and anarthria: Secondary | ICD-10-CM | POA: Diagnosis not present

## 2017-12-03 DIAGNOSIS — F0281 Dementia in other diseases classified elsewhere with behavioral disturbance: Secondary | ICD-10-CM | POA: Diagnosis not present

## 2017-12-03 DIAGNOSIS — M48061 Spinal stenosis, lumbar region without neurogenic claudication: Secondary | ICD-10-CM | POA: Diagnosis not present

## 2017-12-03 DIAGNOSIS — G20 Parkinson's disease: Secondary | ICD-10-CM | POA: Diagnosis not present

## 2017-12-03 DIAGNOSIS — G894 Chronic pain syndrome: Secondary | ICD-10-CM | POA: Diagnosis not present

## 2017-12-04 DIAGNOSIS — G20 Parkinson's disease: Secondary | ICD-10-CM | POA: Diagnosis not present

## 2017-12-04 DIAGNOSIS — M48061 Spinal stenosis, lumbar region without neurogenic claudication: Secondary | ICD-10-CM | POA: Diagnosis not present

## 2017-12-04 DIAGNOSIS — F444 Conversion disorder with motor symptom or deficit: Secondary | ICD-10-CM | POA: Diagnosis not present

## 2017-12-04 DIAGNOSIS — R471 Dysarthria and anarthria: Secondary | ICD-10-CM | POA: Diagnosis not present

## 2017-12-04 DIAGNOSIS — G894 Chronic pain syndrome: Secondary | ICD-10-CM | POA: Diagnosis not present

## 2017-12-04 DIAGNOSIS — F0281 Dementia in other diseases classified elsewhere with behavioral disturbance: Secondary | ICD-10-CM | POA: Diagnosis not present

## 2017-12-05 DIAGNOSIS — F444 Conversion disorder with motor symptom or deficit: Secondary | ICD-10-CM | POA: Diagnosis not present

## 2017-12-05 DIAGNOSIS — F0281 Dementia in other diseases classified elsewhere with behavioral disturbance: Secondary | ICD-10-CM | POA: Diagnosis not present

## 2017-12-05 DIAGNOSIS — G20 Parkinson's disease: Secondary | ICD-10-CM | POA: Diagnosis not present

## 2017-12-05 DIAGNOSIS — R471 Dysarthria and anarthria: Secondary | ICD-10-CM | POA: Diagnosis not present

## 2017-12-05 DIAGNOSIS — M48061 Spinal stenosis, lumbar region without neurogenic claudication: Secondary | ICD-10-CM | POA: Diagnosis not present

## 2017-12-05 DIAGNOSIS — G894 Chronic pain syndrome: Secondary | ICD-10-CM | POA: Diagnosis not present

## 2017-12-09 DIAGNOSIS — M48061 Spinal stenosis, lumbar region without neurogenic claudication: Secondary | ICD-10-CM | POA: Diagnosis not present

## 2017-12-09 DIAGNOSIS — R471 Dysarthria and anarthria: Secondary | ICD-10-CM | POA: Diagnosis not present

## 2017-12-09 DIAGNOSIS — F0281 Dementia in other diseases classified elsewhere with behavioral disturbance: Secondary | ICD-10-CM | POA: Diagnosis not present

## 2017-12-09 DIAGNOSIS — G20 Parkinson's disease: Secondary | ICD-10-CM | POA: Diagnosis not present

## 2017-12-09 DIAGNOSIS — G894 Chronic pain syndrome: Secondary | ICD-10-CM | POA: Diagnosis not present

## 2017-12-09 DIAGNOSIS — F444 Conversion disorder with motor symptom or deficit: Secondary | ICD-10-CM | POA: Diagnosis not present

## 2017-12-11 DIAGNOSIS — M48061 Spinal stenosis, lumbar region without neurogenic claudication: Secondary | ICD-10-CM | POA: Diagnosis not present

## 2017-12-11 DIAGNOSIS — F0281 Dementia in other diseases classified elsewhere with behavioral disturbance: Secondary | ICD-10-CM | POA: Diagnosis not present

## 2017-12-11 DIAGNOSIS — R471 Dysarthria and anarthria: Secondary | ICD-10-CM | POA: Diagnosis not present

## 2017-12-11 DIAGNOSIS — F444 Conversion disorder with motor symptom or deficit: Secondary | ICD-10-CM | POA: Diagnosis not present

## 2017-12-11 DIAGNOSIS — G894 Chronic pain syndrome: Secondary | ICD-10-CM | POA: Diagnosis not present

## 2017-12-11 DIAGNOSIS — G20 Parkinson's disease: Secondary | ICD-10-CM | POA: Diagnosis not present

## 2017-12-13 DIAGNOSIS — F0281 Dementia in other diseases classified elsewhere with behavioral disturbance: Secondary | ICD-10-CM | POA: Diagnosis not present

## 2017-12-13 DIAGNOSIS — R471 Dysarthria and anarthria: Secondary | ICD-10-CM | POA: Diagnosis not present

## 2017-12-13 DIAGNOSIS — F444 Conversion disorder with motor symptom or deficit: Secondary | ICD-10-CM | POA: Diagnosis not present

## 2017-12-13 DIAGNOSIS — G894 Chronic pain syndrome: Secondary | ICD-10-CM | POA: Diagnosis not present

## 2017-12-13 DIAGNOSIS — G20 Parkinson's disease: Secondary | ICD-10-CM | POA: Diagnosis not present

## 2017-12-13 DIAGNOSIS — M48061 Spinal stenosis, lumbar region without neurogenic claudication: Secondary | ICD-10-CM | POA: Diagnosis not present

## 2017-12-16 DIAGNOSIS — F444 Conversion disorder with motor symptom or deficit: Secondary | ICD-10-CM | POA: Diagnosis not present

## 2017-12-16 DIAGNOSIS — F0281 Dementia in other diseases classified elsewhere with behavioral disturbance: Secondary | ICD-10-CM | POA: Diagnosis not present

## 2017-12-16 DIAGNOSIS — G20 Parkinson's disease: Secondary | ICD-10-CM | POA: Diagnosis not present

## 2017-12-16 DIAGNOSIS — M48061 Spinal stenosis, lumbar region without neurogenic claudication: Secondary | ICD-10-CM | POA: Diagnosis not present

## 2017-12-16 DIAGNOSIS — R471 Dysarthria and anarthria: Secondary | ICD-10-CM | POA: Diagnosis not present

## 2017-12-16 DIAGNOSIS — G894 Chronic pain syndrome: Secondary | ICD-10-CM | POA: Diagnosis not present

## 2017-12-17 DIAGNOSIS — F444 Conversion disorder with motor symptom or deficit: Secondary | ICD-10-CM | POA: Diagnosis not present

## 2017-12-17 DIAGNOSIS — G20 Parkinson's disease: Secondary | ICD-10-CM | POA: Diagnosis not present

## 2017-12-17 DIAGNOSIS — M48061 Spinal stenosis, lumbar region without neurogenic claudication: Secondary | ICD-10-CM | POA: Diagnosis not present

## 2017-12-17 DIAGNOSIS — F0281 Dementia in other diseases classified elsewhere with behavioral disturbance: Secondary | ICD-10-CM | POA: Diagnosis not present

## 2017-12-17 DIAGNOSIS — G894 Chronic pain syndrome: Secondary | ICD-10-CM | POA: Diagnosis not present

## 2017-12-17 DIAGNOSIS — R471 Dysarthria and anarthria: Secondary | ICD-10-CM | POA: Diagnosis not present

## 2017-12-18 DIAGNOSIS — F0281 Dementia in other diseases classified elsewhere with behavioral disturbance: Secondary | ICD-10-CM | POA: Diagnosis not present

## 2017-12-18 DIAGNOSIS — M48061 Spinal stenosis, lumbar region without neurogenic claudication: Secondary | ICD-10-CM | POA: Diagnosis not present

## 2017-12-18 DIAGNOSIS — G894 Chronic pain syndrome: Secondary | ICD-10-CM | POA: Diagnosis not present

## 2017-12-18 DIAGNOSIS — F444 Conversion disorder with motor symptom or deficit: Secondary | ICD-10-CM | POA: Diagnosis not present

## 2017-12-18 DIAGNOSIS — G20 Parkinson's disease: Secondary | ICD-10-CM | POA: Diagnosis not present

## 2017-12-18 DIAGNOSIS — R471 Dysarthria and anarthria: Secondary | ICD-10-CM | POA: Diagnosis not present

## 2017-12-19 DIAGNOSIS — G20 Parkinson's disease: Secondary | ICD-10-CM | POA: Diagnosis not present

## 2017-12-19 DIAGNOSIS — F0281 Dementia in other diseases classified elsewhere with behavioral disturbance: Secondary | ICD-10-CM | POA: Diagnosis not present

## 2017-12-19 DIAGNOSIS — R471 Dysarthria and anarthria: Secondary | ICD-10-CM | POA: Diagnosis not present

## 2017-12-19 DIAGNOSIS — M48061 Spinal stenosis, lumbar region without neurogenic claudication: Secondary | ICD-10-CM | POA: Diagnosis not present

## 2017-12-19 DIAGNOSIS — G894 Chronic pain syndrome: Secondary | ICD-10-CM | POA: Diagnosis not present

## 2017-12-19 DIAGNOSIS — F444 Conversion disorder with motor symptom or deficit: Secondary | ICD-10-CM | POA: Diagnosis not present

## 2017-12-24 DIAGNOSIS — M48061 Spinal stenosis, lumbar region without neurogenic claudication: Secondary | ICD-10-CM | POA: Diagnosis not present

## 2017-12-24 DIAGNOSIS — F444 Conversion disorder with motor symptom or deficit: Secondary | ICD-10-CM | POA: Diagnosis not present

## 2017-12-24 DIAGNOSIS — G894 Chronic pain syndrome: Secondary | ICD-10-CM | POA: Diagnosis not present

## 2017-12-24 DIAGNOSIS — G20 Parkinson's disease: Secondary | ICD-10-CM | POA: Diagnosis not present

## 2017-12-24 DIAGNOSIS — R471 Dysarthria and anarthria: Secondary | ICD-10-CM | POA: Diagnosis not present

## 2017-12-24 DIAGNOSIS — F0281 Dementia in other diseases classified elsewhere with behavioral disturbance: Secondary | ICD-10-CM | POA: Diagnosis not present

## 2017-12-25 DIAGNOSIS — M48061 Spinal stenosis, lumbar region without neurogenic claudication: Secondary | ICD-10-CM | POA: Diagnosis not present

## 2017-12-25 DIAGNOSIS — R471 Dysarthria and anarthria: Secondary | ICD-10-CM | POA: Diagnosis not present

## 2017-12-25 DIAGNOSIS — F0281 Dementia in other diseases classified elsewhere with behavioral disturbance: Secondary | ICD-10-CM | POA: Diagnosis not present

## 2017-12-25 DIAGNOSIS — G894 Chronic pain syndrome: Secondary | ICD-10-CM | POA: Diagnosis not present

## 2017-12-25 DIAGNOSIS — F444 Conversion disorder with motor symptom or deficit: Secondary | ICD-10-CM | POA: Diagnosis not present

## 2017-12-25 DIAGNOSIS — G20 Parkinson's disease: Secondary | ICD-10-CM | POA: Diagnosis not present

## 2017-12-26 DIAGNOSIS — R471 Dysarthria and anarthria: Secondary | ICD-10-CM | POA: Diagnosis not present

## 2017-12-26 DIAGNOSIS — G894 Chronic pain syndrome: Secondary | ICD-10-CM | POA: Diagnosis not present

## 2017-12-26 DIAGNOSIS — G20 Parkinson's disease: Secondary | ICD-10-CM | POA: Diagnosis not present

## 2017-12-26 DIAGNOSIS — F0281 Dementia in other diseases classified elsewhere with behavioral disturbance: Secondary | ICD-10-CM | POA: Diagnosis not present

## 2017-12-26 DIAGNOSIS — F444 Conversion disorder with motor symptom or deficit: Secondary | ICD-10-CM | POA: Diagnosis not present

## 2017-12-26 DIAGNOSIS — M48061 Spinal stenosis, lumbar region without neurogenic claudication: Secondary | ICD-10-CM | POA: Diagnosis not present

## 2017-12-27 DIAGNOSIS — F0280 Dementia in other diseases classified elsewhere without behavioral disturbance: Secondary | ICD-10-CM | POA: Diagnosis not present

## 2017-12-27 DIAGNOSIS — I10 Essential (primary) hypertension: Secondary | ICD-10-CM | POA: Diagnosis not present

## 2017-12-27 DIAGNOSIS — G3183 Dementia with Lewy bodies: Secondary | ICD-10-CM | POA: Diagnosis not present

## 2017-12-30 DIAGNOSIS — F0281 Dementia in other diseases classified elsewhere with behavioral disturbance: Secondary | ICD-10-CM | POA: Diagnosis not present

## 2017-12-30 DIAGNOSIS — G894 Chronic pain syndrome: Secondary | ICD-10-CM | POA: Diagnosis not present

## 2017-12-30 DIAGNOSIS — F444 Conversion disorder with motor symptom or deficit: Secondary | ICD-10-CM | POA: Diagnosis not present

## 2017-12-30 DIAGNOSIS — R471 Dysarthria and anarthria: Secondary | ICD-10-CM | POA: Diagnosis not present

## 2017-12-30 DIAGNOSIS — G20 Parkinson's disease: Secondary | ICD-10-CM | POA: Diagnosis not present

## 2017-12-30 DIAGNOSIS — M48061 Spinal stenosis, lumbar region without neurogenic claudication: Secondary | ICD-10-CM | POA: Diagnosis not present

## 2017-12-31 DIAGNOSIS — F0281 Dementia in other diseases classified elsewhere with behavioral disturbance: Secondary | ICD-10-CM | POA: Diagnosis not present

## 2017-12-31 DIAGNOSIS — G894 Chronic pain syndrome: Secondary | ICD-10-CM | POA: Diagnosis not present

## 2017-12-31 DIAGNOSIS — G20 Parkinson's disease: Secondary | ICD-10-CM | POA: Diagnosis not present

## 2017-12-31 DIAGNOSIS — M48061 Spinal stenosis, lumbar region without neurogenic claudication: Secondary | ICD-10-CM | POA: Diagnosis not present

## 2017-12-31 DIAGNOSIS — F444 Conversion disorder with motor symptom or deficit: Secondary | ICD-10-CM | POA: Diagnosis not present

## 2017-12-31 DIAGNOSIS — R471 Dysarthria and anarthria: Secondary | ICD-10-CM | POA: Diagnosis not present

## 2018-02-05 DIAGNOSIS — G20 Parkinson's disease: Secondary | ICD-10-CM | POA: Diagnosis not present

## 2018-02-05 DIAGNOSIS — F0281 Dementia in other diseases classified elsewhere with behavioral disturbance: Secondary | ICD-10-CM | POA: Diagnosis not present

## 2018-02-05 DIAGNOSIS — K5900 Constipation, unspecified: Secondary | ICD-10-CM | POA: Diagnosis not present

## 2018-02-18 IMAGING — CR DG CHEST 2V
2 series · 2 of 2 positions shown · non-contrast
Comparison: None.

CLINICAL DATA: Pt c/o weakness C/O FATIGUE X3 DAYS. PT HAS A HX OF
PARKINSON'S DISEASE. UPON EMS' ARRIVAL, THE PT'S BP WAS 190/110,
WITH NO PREVIOUS HISTORY. PT DENIES HEADACHE, DIZZINESS, OR VISUAL
CHANGES. PT ALSO C/O CHRONIC LEFT LOWER BACK PAIN. DENIES INJURY.

EXAM:
CHEST  2 VIEW

[w chest lat]
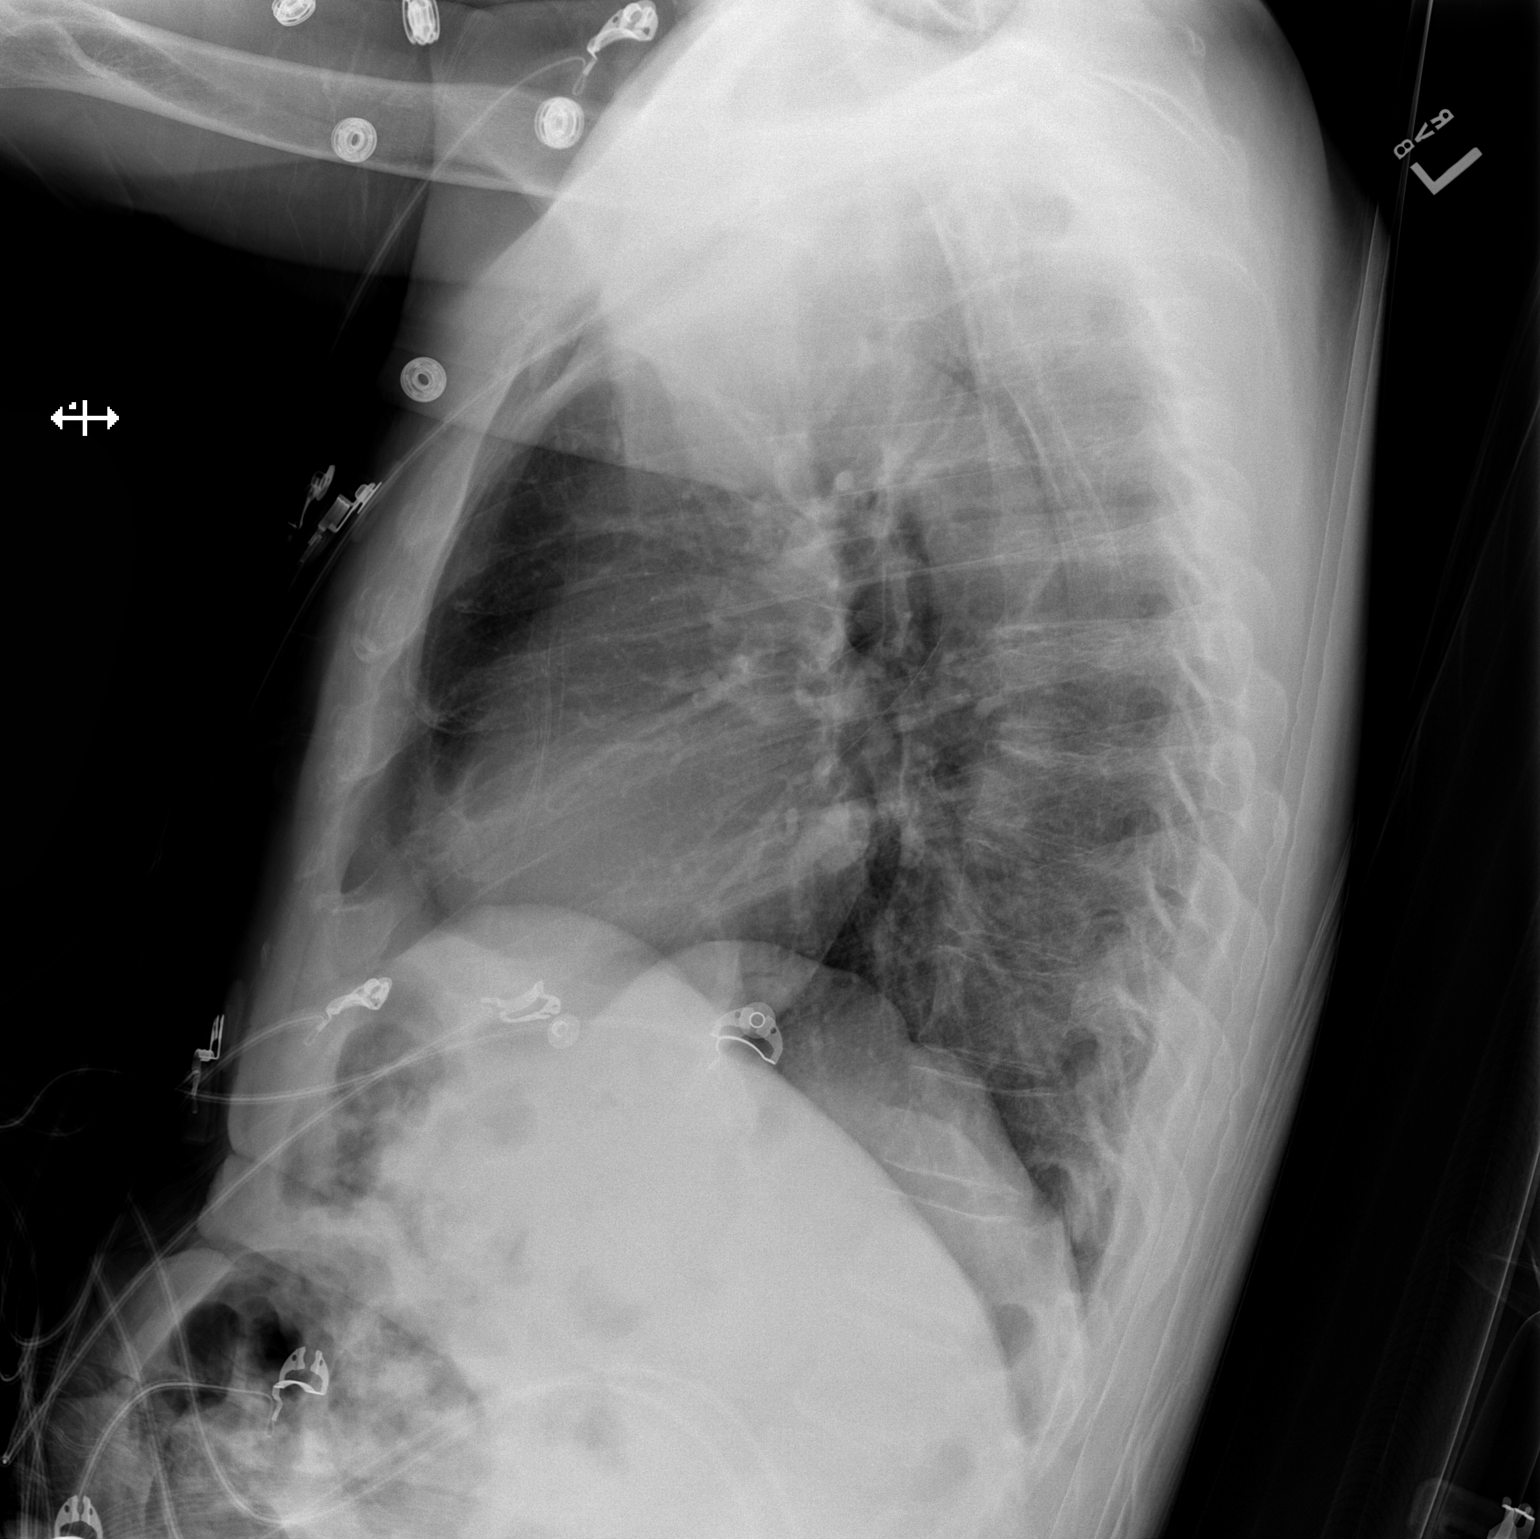

[x chest ap]
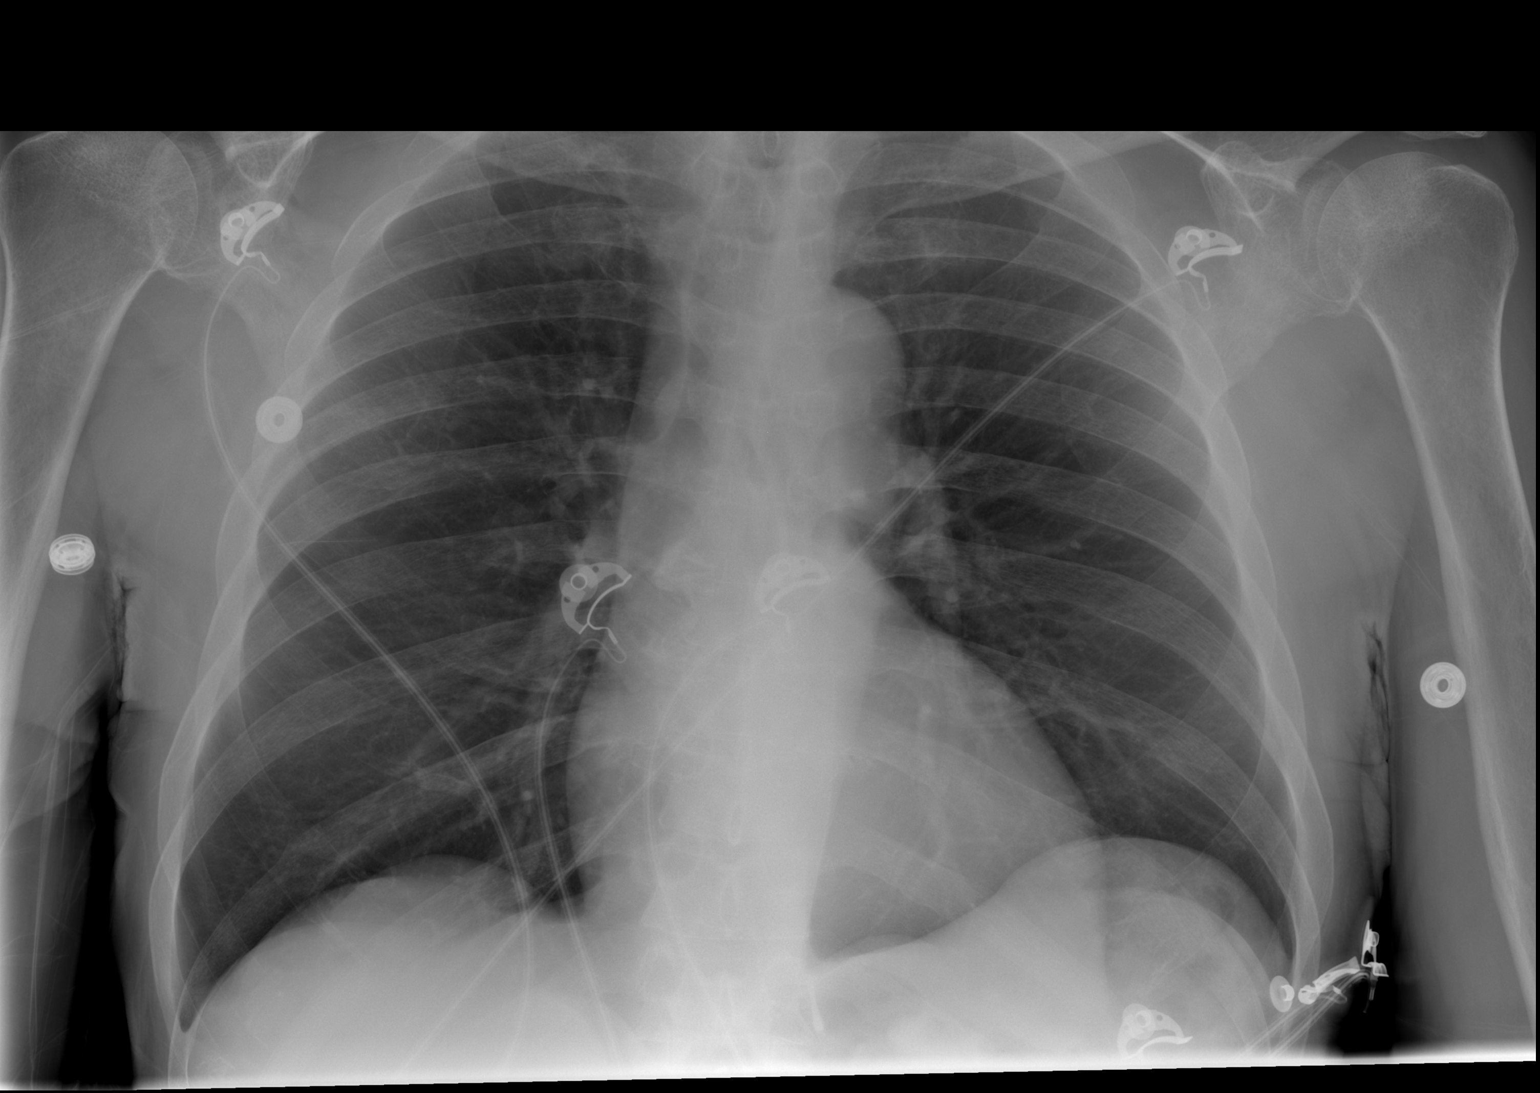

[2 of 2 positions shown; findings below may reference images not displayed]

FINDINGS: The heart size and mediastinal contours are within normal limits.
Both lungs are clear. The visualized skeletal structures are
unremarkable.
IMPRESSION: No active cardiopulmonary disease.

## 2018-03-20 DIAGNOSIS — T8522XA Displacement of intraocular lens, initial encounter: Secondary | ICD-10-CM | POA: Diagnosis not present

## 2018-03-20 DIAGNOSIS — H47323 Drusen of optic disc, bilateral: Secondary | ICD-10-CM | POA: Diagnosis not present

## 2018-03-20 DIAGNOSIS — H40023 Open angle with borderline findings, high risk, bilateral: Secondary | ICD-10-CM | POA: Diagnosis not present

## 2018-04-14 DIAGNOSIS — I119 Hypertensive heart disease without heart failure: Secondary | ICD-10-CM | POA: Diagnosis not present

## 2018-04-14 DIAGNOSIS — Z23 Encounter for immunization: Secondary | ICD-10-CM | POA: Diagnosis not present

## 2018-04-14 DIAGNOSIS — G20 Parkinson's disease: Secondary | ICD-10-CM | POA: Diagnosis not present

## 2018-04-14 DIAGNOSIS — M810 Age-related osteoporosis without current pathological fracture: Secondary | ICD-10-CM | POA: Diagnosis not present

## 2018-04-14 DIAGNOSIS — R2689 Other abnormalities of gait and mobility: Secondary | ICD-10-CM | POA: Diagnosis not present

## 2018-04-14 DIAGNOSIS — G300 Alzheimer's disease with early onset: Secondary | ICD-10-CM | POA: Diagnosis not present

## 2018-05-19 DIAGNOSIS — Z743 Need for continuous supervision: Secondary | ICD-10-CM | POA: Diagnosis not present

## 2018-05-19 DIAGNOSIS — S0993XA Unspecified injury of face, initial encounter: Secondary | ICD-10-CM | POA: Diagnosis not present

## 2018-05-19 DIAGNOSIS — Z043 Encounter for examination and observation following other accident: Secondary | ICD-10-CM | POA: Diagnosis not present

## 2018-05-19 DIAGNOSIS — G20 Parkinson's disease: Secondary | ICD-10-CM | POA: Diagnosis not present

## 2018-05-19 DIAGNOSIS — S0081XA Abrasion of other part of head, initial encounter: Secondary | ICD-10-CM | POA: Diagnosis not present

## 2018-05-19 DIAGNOSIS — M7989 Other specified soft tissue disorders: Secondary | ICD-10-CM | POA: Diagnosis not present

## 2018-05-19 DIAGNOSIS — R220 Localized swelling, mass and lump, head: Secondary | ICD-10-CM | POA: Diagnosis not present

## 2018-05-30 DIAGNOSIS — G20 Parkinson's disease: Secondary | ICD-10-CM | POA: Diagnosis not present

## 2018-05-30 DIAGNOSIS — F0390 Unspecified dementia without behavioral disturbance: Secondary | ICD-10-CM | POA: Diagnosis not present

## 2018-06-14 DIAGNOSIS — S32050A Wedge compression fracture of fifth lumbar vertebra, initial encounter for closed fracture: Secondary | ICD-10-CM | POA: Diagnosis present

## 2018-06-14 DIAGNOSIS — I361 Nonrheumatic tricuspid (valve) insufficiency: Secondary | ICD-10-CM | POA: Diagnosis not present

## 2018-06-14 DIAGNOSIS — R1012 Left upper quadrant pain: Secondary | ICD-10-CM | POA: Diagnosis not present

## 2018-06-14 DIAGNOSIS — I081 Rheumatic disorders of both mitral and tricuspid valves: Secondary | ICD-10-CM | POA: Diagnosis not present

## 2018-06-14 DIAGNOSIS — S32050D Wedge compression fracture of fifth lumbar vertebra, subsequent encounter for fracture with routine healing: Secondary | ICD-10-CM | POA: Diagnosis not present

## 2018-06-14 DIAGNOSIS — M4310 Spondylolisthesis, site unspecified: Secondary | ICD-10-CM | POA: Diagnosis present

## 2018-06-14 DIAGNOSIS — R32 Unspecified urinary incontinence: Secondary | ICD-10-CM | POA: Diagnosis present

## 2018-06-14 DIAGNOSIS — R55 Syncope and collapse: Secondary | ICD-10-CM | POA: Diagnosis not present

## 2018-06-14 DIAGNOSIS — M47816 Spondylosis without myelopathy or radiculopathy, lumbar region: Secondary | ICD-10-CM | POA: Diagnosis present

## 2018-06-14 DIAGNOSIS — R40241 Glasgow coma scale score 13-15, unspecified time: Secondary | ICD-10-CM | POA: Diagnosis present

## 2018-06-14 DIAGNOSIS — Z79899 Other long term (current) drug therapy: Secondary | ICD-10-CM | POA: Diagnosis not present

## 2018-06-14 DIAGNOSIS — M549 Dorsalgia, unspecified: Secondary | ICD-10-CM | POA: Diagnosis not present

## 2018-06-14 DIAGNOSIS — G20 Parkinson's disease: Secondary | ICD-10-CM | POA: Diagnosis present

## 2018-06-14 DIAGNOSIS — F0390 Unspecified dementia without behavioral disturbance: Secondary | ICD-10-CM | POA: Diagnosis not present

## 2018-06-14 DIAGNOSIS — G903 Multi-system degeneration of the autonomic nervous system: Secondary | ICD-10-CM | POA: Diagnosis present

## 2018-06-14 DIAGNOSIS — Z8249 Family history of ischemic heart disease and other diseases of the circulatory system: Secondary | ICD-10-CM | POA: Diagnosis not present

## 2018-06-14 DIAGNOSIS — R4182 Altered mental status, unspecified: Secondary | ICD-10-CM | POA: Diagnosis not present

## 2018-06-14 DIAGNOSIS — R269 Unspecified abnormalities of gait and mobility: Secondary | ICD-10-CM | POA: Diagnosis not present

## 2018-06-14 DIAGNOSIS — R531 Weakness: Secondary | ICD-10-CM | POA: Diagnosis not present

## 2018-06-14 DIAGNOSIS — I9589 Other hypotension: Secondary | ICD-10-CM | POA: Diagnosis present

## 2018-06-14 DIAGNOSIS — K5641 Fecal impaction: Secondary | ICD-10-CM | POA: Diagnosis present

## 2018-06-14 DIAGNOSIS — Z9181 History of falling: Secondary | ICD-10-CM | POA: Diagnosis not present

## 2018-06-14 DIAGNOSIS — I959 Hypotension, unspecified: Secondary | ICD-10-CM | POA: Diagnosis not present

## 2018-06-14 DIAGNOSIS — Z743 Need for continuous supervision: Secondary | ICD-10-CM | POA: Diagnosis not present

## 2018-06-14 DIAGNOSIS — M48061 Spinal stenosis, lumbar region without neurogenic claudication: Secondary | ICD-10-CM | POA: Diagnosis present

## 2018-06-14 DIAGNOSIS — N2889 Other specified disorders of kidney and ureter: Secondary | ICD-10-CM | POA: Diagnosis present

## 2018-06-14 DIAGNOSIS — M47812 Spondylosis without myelopathy or radiculopathy, cervical region: Secondary | ICD-10-CM | POA: Diagnosis present

## 2018-06-14 DIAGNOSIS — F0280 Dementia in other diseases classified elsewhere without behavioral disturbance: Secondary | ICD-10-CM | POA: Diagnosis not present

## 2018-06-14 DIAGNOSIS — I10 Essential (primary) hypertension: Secondary | ICD-10-CM | POA: Diagnosis present

## 2018-07-01 DIAGNOSIS — G20 Parkinson's disease: Secondary | ICD-10-CM | POA: Diagnosis not present

## 2018-07-01 DIAGNOSIS — F0280 Dementia in other diseases classified elsewhere without behavioral disturbance: Secondary | ICD-10-CM | POA: Diagnosis not present

## 2018-07-01 DIAGNOSIS — N2889 Other specified disorders of kidney and ureter: Secondary | ICD-10-CM | POA: Diagnosis not present

## 2018-07-01 DIAGNOSIS — M4857XA Collapsed vertebra, not elsewhere classified, lumbosacral region, initial encounter for fracture: Secondary | ICD-10-CM | POA: Diagnosis not present

## 2018-07-01 DIAGNOSIS — I959 Hypotension, unspecified: Secondary | ICD-10-CM | POA: Diagnosis not present

## 2018-09-29 IMAGING — MR MR HEAD W/O CM
9 of 10 series · 36 of 48 positions shown · non-contrast
Comparison: Earlier same day

CLINICAL DATA: Acute onset of speech disturbance in generalized
weakness several hours ago.

EXAM:
MRI HEAD WITHOUT CONTRAST
TECHNIQUE: Multiplanar, multiecho pulse sequences of the brain and surrounding
structures were obtained without intravenous contrast.

[Series 3: DWI · axial · 3.0mm · 1.09mm/px · z∈[-17,+114]mm · 9 of 90 slices shown (1 of 4)]
[im 1/90]
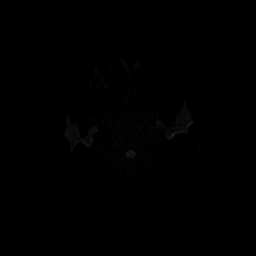
[im 12/90]
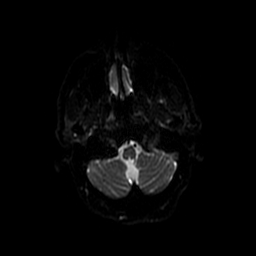
[im 23/90]
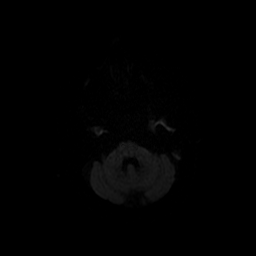
[im 34/90]
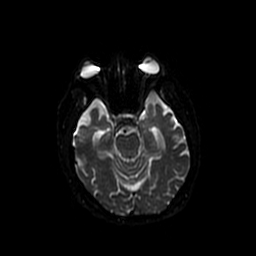
[im 45/90]
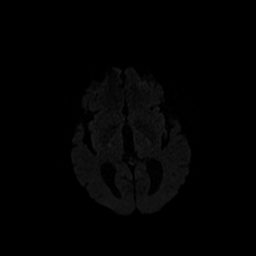
[im 56/90]
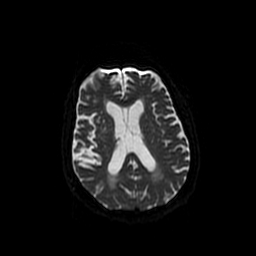
[im 67/90]
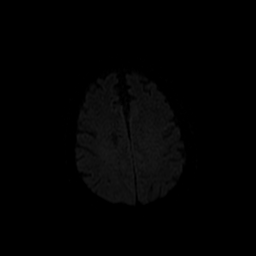
[im 78/90]
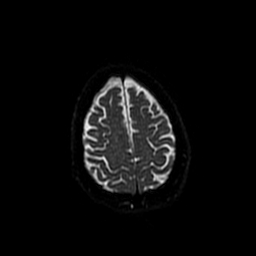
[im 90/90]
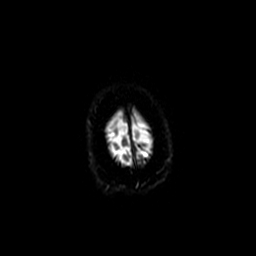

[Series 4: T1 · sagittal · 5.0mm · 0.47mm/px · 3 of 23 slices shown]
[im 1/23]
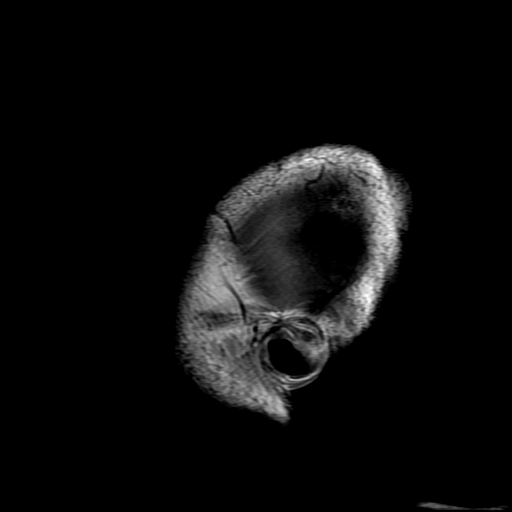
[im 12/23]
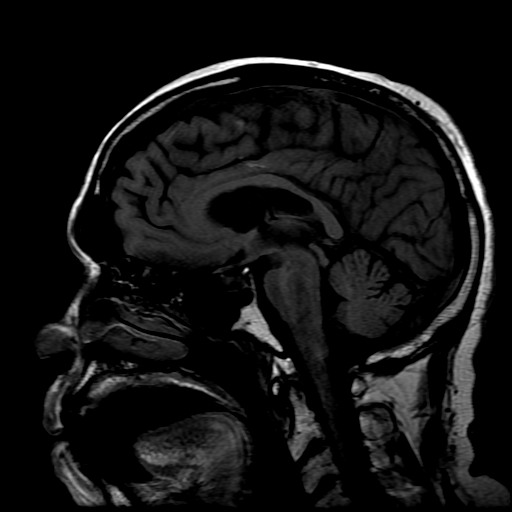
[im 23/23]
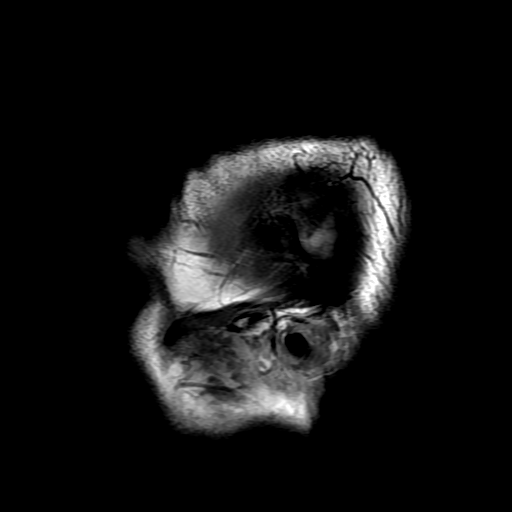

[Series 5: T2 · axial · 5.0mm · 0.43mm/px · z∈[-21,+110]mm · 3 of 23 slices shown (1 of 2)]
[im 1/23]
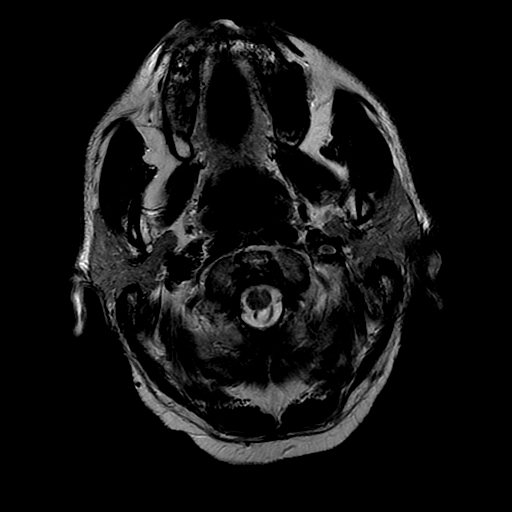
[im 12/23]
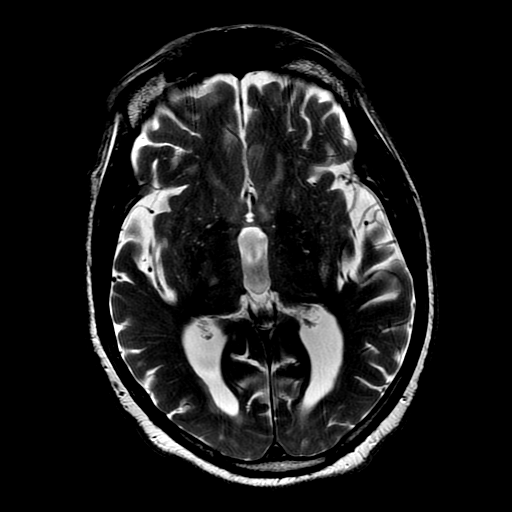
[im 23/23]
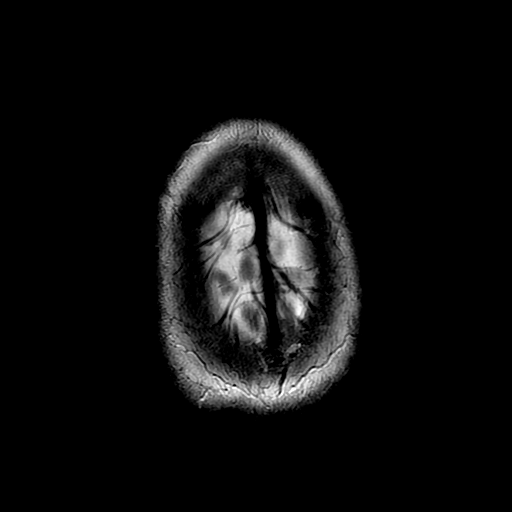

[Series 6: DWI · coronal · 5.0mm · 1.09mm/px · 7 of 66 slices shown (2 of 4)]
[im 1/66]
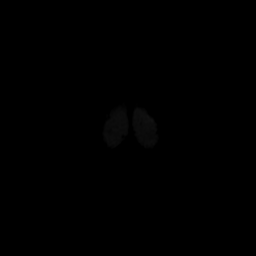
[im 11/66]
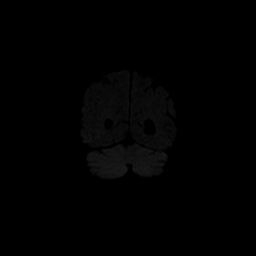
[im 22/66]
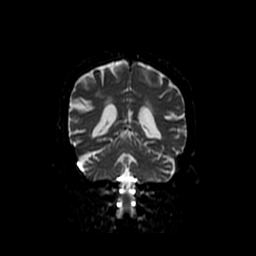
[im 33/66]
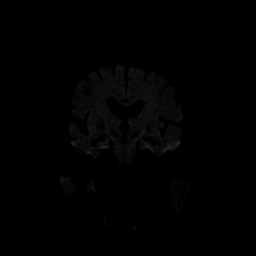
[im 44/66]
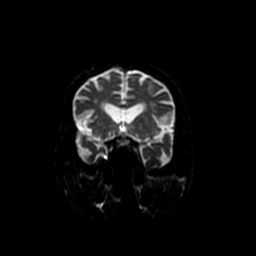
[im 55/66]
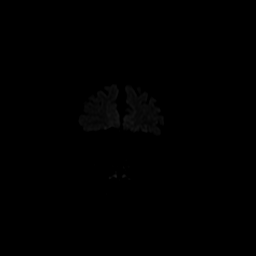
[im 66/66]
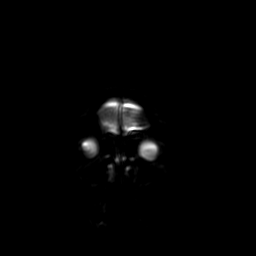

[Series 7: FLAIR · axial · 5.0mm · 0.43mm/px · 1 of 12 slices shown]
[im 1/12]
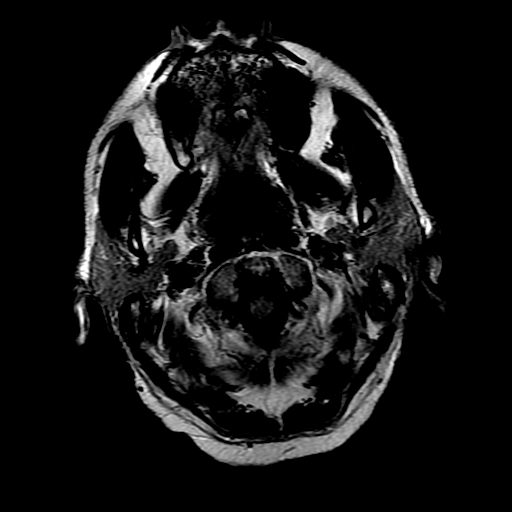

[Series 8: ax mpgr · axial · 5.0mm · 0.43mm/px · 1 of 23 slices shown]
[im 1/23]
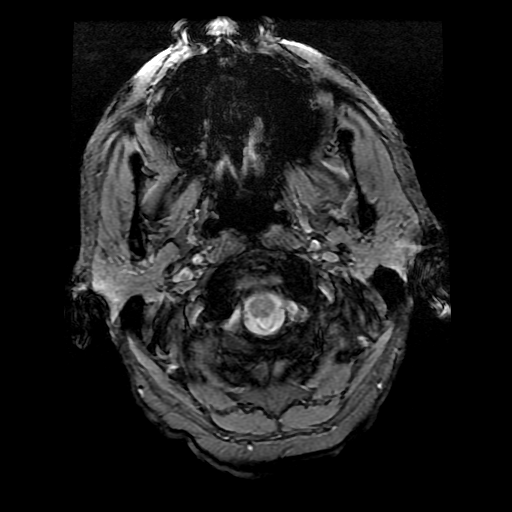

[Series 10: T2 · coronal · 5.0mm · 0.39mm/px · 3 of 25 slices shown (2 of 2)]
[im 1/25]
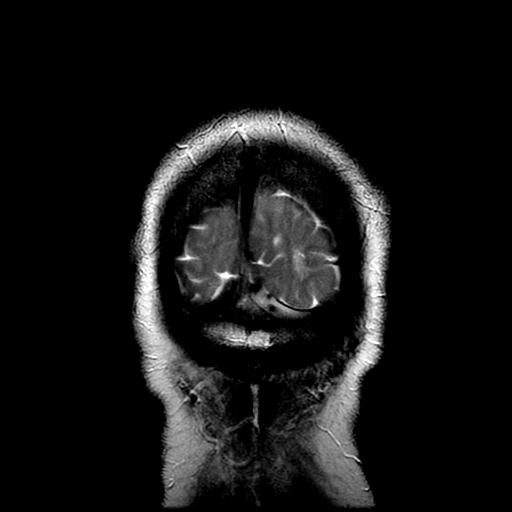
[im 13/25]
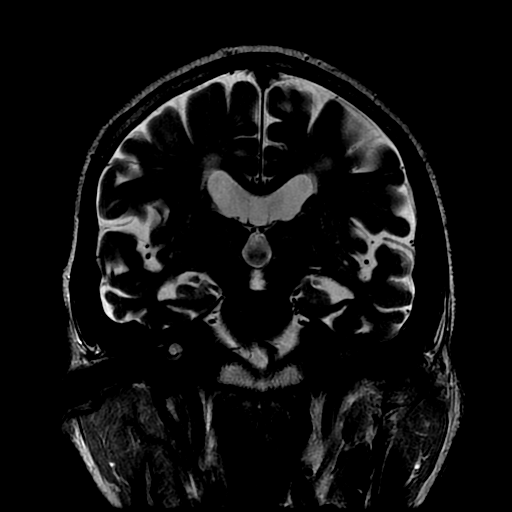
[im 25/25]
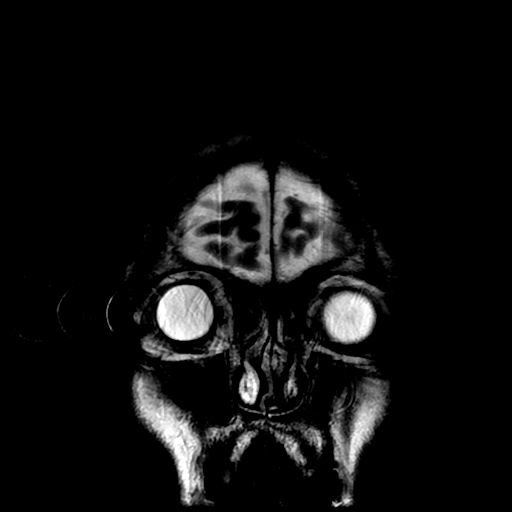

[Series 300: DWI · axial · 3.0mm · 1.09mm/px · z∈[-17,+114]mm · 5 of 45 slices shown (3 of 4)]
[im 1/45]
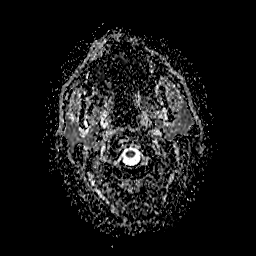
[im 12/45]
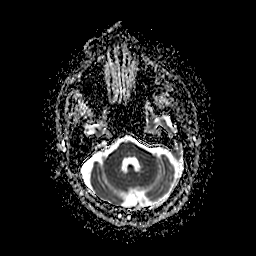
[im 23/45]
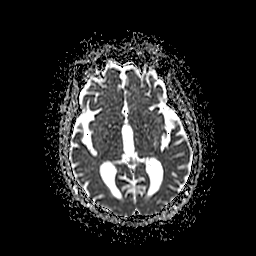
[im 34/45]
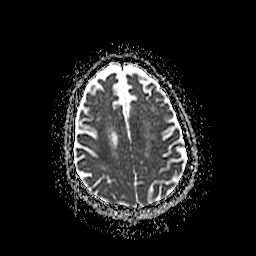
[im 45/45]
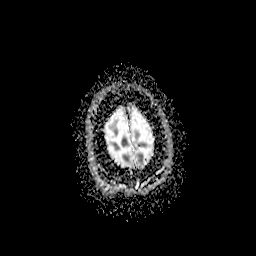

[Series 600: DWI · coronal · 5.0mm · 1.09mm/px · 4 of 33 slices shown (4 of 4)]
[im 1/33]
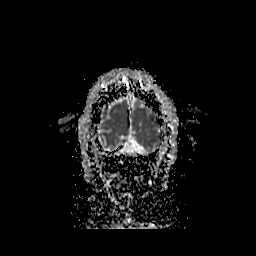
[im 11/33]
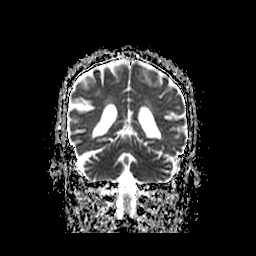
[im 22/33]
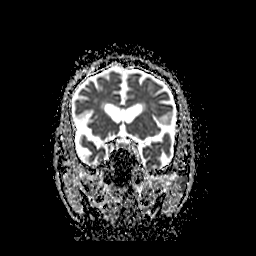
[im 33/33]
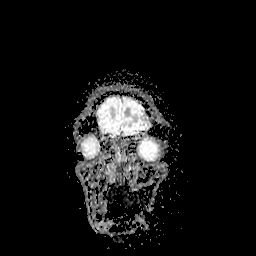

[36 of 48 positions shown; findings below may reference images not displayed]

FINDINGS: Brain: Diffusion imaging does not show any acute or subacute
infarction. There chronic small-vessel ischemic changes affecting
the pons. Small-vessel changes also noted affecting the thalami and
basal ganglia. Cerebral hemispheres show chronic small-vessel
ischemic changes affecting the deep and subcortical white matter. No
large vessel territory infarction. No mass lesion, hemorrhage,
hydrocephalus or extra-axial collection. No pituitary mass.

Vascular: Major vessels at the base of the brain show flow.

Skull and upper cervical spine: Negative

Sinuses/Orbits: Clear/normal

Other: None significant
IMPRESSION: No acute finding. Chronic small-vessel ischemic changes affecting
the brainstem, thalami, basal ganglia and cerebral hemispheric white
matter.

## 2019-07-31 DEATH — deceased
